# Patient Record
Sex: Female | Born: 1955 | Race: White | Hispanic: No | Marital: Single | Smoking: Former smoker
Health system: Southern US, Community
[De-identification: ages and names within clinical notes are randomized; demographics above are authoritative.]

## PROBLEM LIST (undated history)

## (undated) DIAGNOSIS — C229 Malignant neoplasm of liver, not specified as primary or secondary: Secondary | ICD-10-CM

## (undated) DIAGNOSIS — C801 Malignant (primary) neoplasm, unspecified: Secondary | ICD-10-CM

## (undated) DIAGNOSIS — N289 Disorder of kidney and ureter, unspecified: Secondary | ICD-10-CM

---

## 2000-04-27 ENCOUNTER — Encounter: Payer: Self-pay | Admitting: Family Medicine

## 2000-04-27 ENCOUNTER — Ambulatory Visit (HOSPITAL_COMMUNITY): Admission: RE | Admit: 2000-04-27 | Discharge: 2000-04-27 | Payer: Self-pay | Admitting: Family Medicine

## 2019-06-02 ENCOUNTER — Other Ambulatory Visit: Payer: Self-pay | Admitting: Family Medicine

## 2019-06-02 DIAGNOSIS — K439 Ventral hernia without obstruction or gangrene: Secondary | ICD-10-CM

## 2019-06-02 DIAGNOSIS — K429 Umbilical hernia without obstruction or gangrene: Secondary | ICD-10-CM

## 2019-06-03 ENCOUNTER — Ambulatory Visit
Admission: RE | Admit: 2019-06-03 | Discharge: 2019-06-03 | Disposition: A | Discharge status: Home / Self Care | Payer: Medicare Other | Source: Ambulatory Visit | Attending: Family Medicine | Admitting: Family Medicine

## 2019-06-03 DIAGNOSIS — K439 Ventral hernia without obstruction or gangrene: Secondary | ICD-10-CM

## 2019-06-03 DIAGNOSIS — K429 Umbilical hernia without obstruction or gangrene: Secondary | ICD-10-CM

## 2019-06-10 ENCOUNTER — Inpatient Hospital Stay (HOSPITAL_COMMUNITY)
Admission: EM | Admit: 2019-06-10 | Discharge: 2019-06-23 | DRG: 329 | Disposition: A | Discharge status: Home / Self Care | Payer: Medicare Other | Attending: Internal Medicine | Admitting: Internal Medicine

## 2019-06-10 ENCOUNTER — Encounter (HOSPITAL_COMMUNITY): Payer: Self-pay

## 2019-06-10 ENCOUNTER — Other Ambulatory Visit: Payer: Self-pay

## 2019-06-10 ENCOUNTER — Emergency Department (HOSPITAL_COMMUNITY): Payer: Medicare Other

## 2019-06-10 ENCOUNTER — Other Ambulatory Visit: Payer: Self-pay | Admitting: Surgery

## 2019-06-10 DIAGNOSIS — Z809 Family history of malignant neoplasm, unspecified: Secondary | ICD-10-CM

## 2019-06-10 DIAGNOSIS — D72829 Elevated white blood cell count, unspecified: Secondary | ICD-10-CM | POA: Diagnosis present

## 2019-06-10 DIAGNOSIS — C189 Malignant neoplasm of colon, unspecified: Secondary | ICD-10-CM | POA: Diagnosis present

## 2019-06-10 DIAGNOSIS — E876 Hypokalemia: Secondary | ICD-10-CM | POA: Diagnosis present

## 2019-06-10 DIAGNOSIS — C787 Secondary malignant neoplasm of liver and intrahepatic bile duct: Secondary | ICD-10-CM | POA: Diagnosis present

## 2019-06-10 DIAGNOSIS — Z20828 Contact with and (suspected) exposure to other viral communicable diseases: Secondary | ICD-10-CM | POA: Diagnosis present

## 2019-06-10 DIAGNOSIS — R634 Abnormal weight loss: Secondary | ICD-10-CM | POA: Diagnosis present

## 2019-06-10 DIAGNOSIS — R0602 Shortness of breath: Secondary | ICD-10-CM

## 2019-06-10 DIAGNOSIS — C786 Secondary malignant neoplasm of retroperitoneum and peritoneum: Secondary | ICD-10-CM | POA: Diagnosis present

## 2019-06-10 DIAGNOSIS — Z87891 Personal history of nicotine dependence: Secondary | ICD-10-CM

## 2019-06-10 DIAGNOSIS — E871 Hypo-osmolality and hyponatremia: Secondary | ICD-10-CM

## 2019-06-10 DIAGNOSIS — D649 Anemia, unspecified: Secondary | ICD-10-CM | POA: Diagnosis present

## 2019-06-10 DIAGNOSIS — R7401 Elevation of levels of liver transaminase levels: Secondary | ICD-10-CM | POA: Diagnosis not present

## 2019-06-10 DIAGNOSIS — E872 Acidosis: Secondary | ICD-10-CM | POA: Diagnosis present

## 2019-06-10 DIAGNOSIS — Z6820 Body mass index (BMI) 20.0-20.9, adult: Secondary | ICD-10-CM

## 2019-06-10 DIAGNOSIS — C184 Malignant neoplasm of transverse colon: Principal | ICD-10-CM | POA: Diagnosis present

## 2019-06-10 DIAGNOSIS — E878 Other disorders of electrolyte and fluid balance, not elsewhere classified: Secondary | ICD-10-CM | POA: Diagnosis present

## 2019-06-10 DIAGNOSIS — K567 Ileus, unspecified: Secondary | ICD-10-CM | POA: Diagnosis not present

## 2019-06-10 DIAGNOSIS — E43 Unspecified severe protein-calorie malnutrition: Secondary | ICD-10-CM | POA: Diagnosis present

## 2019-06-10 DIAGNOSIS — K56609 Unspecified intestinal obstruction, unspecified as to partial versus complete obstruction: Secondary | ICD-10-CM

## 2019-06-10 HISTORY — DX: Disorder of kidney and ureter, unspecified: N28.9

## 2019-06-10 LAB — URINALYSIS, ROUTINE W REFLEX MICROSCOPIC
Bacteria, UA: NONE SEEN
Bilirubin Urine: NEGATIVE
Glucose, UA: NEGATIVE mg/dL
Hgb urine dipstick: NEGATIVE
Ketones, ur: 80 mg/dL — AB
Nitrite: NEGATIVE
Protein, ur: 100 mg/dL — AB
Specific Gravity, Urine: 1.024 (ref 1.005–1.030)
pH: 5 (ref 5.0–8.0)

## 2019-06-10 LAB — COMPREHENSIVE METABOLIC PANEL
ALT: 25 U/L (ref 0–44)
AST: 26 U/L (ref 15–41)
Albumin: 3.3 g/dL — ABNORMAL LOW (ref 3.5–5.0)
Alkaline Phosphatase: 145 U/L — ABNORMAL HIGH (ref 38–126)
Anion gap: 21 — ABNORMAL HIGH (ref 5–15)
BUN: 12 mg/dL (ref 8–23)
CO2: 25 mmol/L (ref 22–32)
Calcium: 8.9 mg/dL (ref 8.9–10.3)
Chloride: 87 mmol/L — ABNORMAL LOW (ref 98–111)
Creatinine, Ser: 0.61 mg/dL (ref 0.44–1.00)
GFR calc Af Amer: >60 mL/min (ref >60)
GFR calc non Af Amer: >60 mL/min (ref >60)
Glucose, Bld: 107 mg/dL — ABNORMAL HIGH (ref 70–99)
Potassium: 2.8 mmol/L — ABNORMAL LOW (ref 3.5–5.1)
Sodium: 133 mmol/L — ABNORMAL LOW (ref 135–145)
Total Bilirubin: 1 mg/dL (ref 0.3–1.2)
Total Protein: 7.1 g/dL (ref 6.5–8.1)

## 2019-06-10 LAB — CBC
HCT: 40.2 % (ref 36.0–46.0)
Hemoglobin: 13.1 g/dL (ref 12.0–15.0)
MCH: 27 pg (ref 26.0–34.0)
MCHC: 32.6 g/dL (ref 30.0–36.0)
MCV: 82.9 fL (ref 80.0–100.0)
Platelets: 440 10*3/uL — ABNORMAL HIGH (ref 150–400)
RBC: 4.85 MIL/uL (ref 3.87–5.11)
RDW: 12.9 % (ref 11.5–15.5)
WBC: 12.3 10*3/uL — ABNORMAL HIGH (ref 4.0–10.5)
nRBC: 0 % (ref 0.0–0.2)

## 2019-06-10 LAB — LIPASE, BLOOD: Lipase: 22 U/L (ref 11–51)

## 2019-06-10 MED ORDER — SODIUM CHLORIDE (PF) 0.9 % IJ SOLN
INTRAMUSCULAR | Status: AC
  Administered 2019-06-17: 3 mL via INTRAVENOUS
  Filled 2019-06-10: qty 50

## 2019-06-10 MED ORDER — SODIUM CHLORIDE 0.9% FLUSH
3.0000 mL | Freq: Once | INTRAVENOUS | Status: AC
Start: 2019-06-10 — End: 2019-06-17
  Administered 2019-06-17: 3 mL via INTRAVENOUS

## 2019-06-10 MED ORDER — POTASSIUM CHLORIDE 10 MEQ/100ML IV SOLN
10.0000 meq | Freq: Once | INTRAVENOUS | Status: AC
  Administered 2019-06-10: 23:00:00 10 meq via INTRAVENOUS
  Filled 2019-06-10: qty 100

## 2019-06-10 MED ORDER — SODIUM CHLORIDE 0.9 % IV BOLUS
1000.0000 mL | Freq: Once | INTRAVENOUS | Status: AC
  Administered 2019-06-10: 1000 mL via INTRAVENOUS

## 2019-06-10 MED ORDER — IOHEXOL 300 MG/ML  SOLN
30.0000 mL | Freq: Once | INTRAMUSCULAR | Status: AC | PRN
  Administered 2019-06-11: 30 mL via ORAL

## 2019-06-10 MED ORDER — IOHEXOL 300 MG/ML  SOLN
100.0000 mL | Freq: Once | INTRAMUSCULAR | Status: AC | PRN
  Administered 2019-06-11: 100 mL via INTRAVENOUS

## 2019-06-10 MED ORDER — POTASSIUM CHLORIDE CRYS ER 20 MEQ PO TBCR
40.0000 meq | EXTENDED_RELEASE_TABLET | Freq: Once | ORAL | Status: AC
  Administered 2019-06-10: 23:00:00 40 meq via ORAL
  Filled 2019-06-10: qty 2

## 2019-06-10 NOTE — ED Triage Notes (Signed)
Patient reports that she has had abdominal pain, N/V/D x 6 weeks. According to paperwork, patient was referred to a surgeon for a chronic small umbilical hernia. Patient states today, they did not think her symptoms were related to the hernia and told her to come to the ED for Iv fluids, CT scan.

## 2019-06-10 NOTE — ED Provider Notes (Signed)
[facility] DEPT Provider Note   CSN: [id number] Arrival date & time: 06/10/19  1156     History   Chief Complaint Chief Complaint  Patient presents with   Abdominal Pain   Emesis   Diarrhea    HPI Ellen Larson is a 63 y.o. female with no significant past medical history presents to the ED with a 6-week history of nausea and bilious vomiting.  Patient has not regularly seen a primary care provider in over 20 years, but saw a doctor from [place] who referred her to a surgeon for a small umbilical hernia that was discovered on a limited US.  Surgeon sent her to the ED after learning of her history, believing that her symptoms are not related to the hernia and suspecting CT need for further evaluation.  Patient admits to a significantly diminished appetite, attributable mostly to the fact that PO intake contributes to her nausea and vomiting.  She reports a 20 pound weight loss in 6 weeks and will have occasional left upper quadrant abdominal pain.  She feels lethargic and nauseated at all times.  She was tested for COVID approximately 10 days ago which was negative.  No sick contacts.  Patient denies any fevers, chills, chest pain, hematemesis, hematochezia, melena, shortness of breath, dizziness, or focal neurologic symptoms.  She reports that her last large, full formed bowel movement was over 2 months ago and instead has scant, thin or pebble shaped feces once every couple of days.  She has never had a colonoscopy.     HPI  Past Medical History:  Diagnosis Date   Renal disorder     Patient Active Problem List   Diagnosis Date Noted   SBO (small bowel obstruction) ([place]) 06/11/2019   Hypokalemia 06/11/2019   Weight loss 06/11/2019    Past Surgical History:  Procedure Laterality Date   CERVICAL ABLATION     kidney stone removal       OB History   No obstetric history on file.      Home Medications    Prior to  Admission medications   Not on File    Family History Family History  Problem Relation Age of Onset   Heart failure Mother    Kidney failure Mother    Cancer Mother     Social History Social History   Tobacco Use   Smoking status: Former Smoker   Smokeless tobacco: Never Used  Substance Use Topics   Alcohol use: Yes   Drug use: Not Currently     Allergies   Patient has no known allergies.   Review of Systems Review of Systems Ten systems are reviewed and are negative for acute change except as noted in the HPI   Physical Exam Updated Vital Signs BP (!) 153/98    Pulse (!) 136    Temp 99 F (37.2 C) (Oral)    Resp 20    Ht 5\' 3"  (1.6 m)    Wt 49 kg    SpO2 98%    BMI 19.13 kg/m   Physical Exam Constitutional:      Appearance: Normal appearance.  HENT:     Head: Normocephalic and atraumatic.  Eyes:     General: No scleral icterus.    Conjunctiva/sclera: Conjunctivae normal.  Cardiovascular:     Rate and Rhythm: Normal rate and regular rhythm.  Pulmonary:     Effort: Pulmonary effort is normal.  Abdominal:     General: Bowel sounds are  increased.     Palpations: Abdomen is soft. There is mass.     Tenderness: There is abdominal tenderness in the left upper quadrant. There is no guarding.     Hernia: A hernia is present. Hernia is present in the umbilical area.  Neurological:     Mental Status: She is alert.     GCS: GCS eye subscore is 4. GCS verbal subscore is 5. GCS motor subscore is 6.  Psychiatric:        Mood and Affect: Mood normal.        Behavior: Behavior normal.        Thought Content: Thought content normal.      ED Treatments / Results  Labs (all labs ordered are listed, but only abnormal results are displayed) Labs Reviewed  COMPREHENSIVE METABOLIC PANEL - Abnormal; Notable for the following components:      Result Value   Sodium 133 (*)    Potassium 2.8 (*)    Chloride 87 (*)    Glucose, Bld 107 (*)    Albumin 3.3 (*)     Alkaline Phosphatase 145 (*)    Anion gap 21 (*)    All other components within normal limits  CBC - Abnormal; Notable for the following components:   WBC 12.3 (*)    Platelets 440 (*)    All other components within normal limits  URINALYSIS, ROUTINE W REFLEX MICROSCOPIC - Abnormal; Notable for the following components:   APPearance CLOUDY (*)    Ketones, ur 80 (*)    Protein, ur 100 (*)    Leukocytes,Ua TRACE (*)    All other components within normal limits  SARS CORONAVIRUS 2 (HOSPITAL ORDER, [facility])  LIPASE, BLOOD    EKG EKG Interpretation  Date/Time:  Friday June 10 2019 21:18:47 EDT Ventricular Rate:  97 PR Interval:    QRS Duration: 90 QT Interval:  315 QTC Calculation: 401 R Axis:   68 Text Interpretation:  Sinus rhythm Borderline repolarization abnormality Baseline wander in lead(s) V1 V5 V6 No previous ECGs available Confirmed by Fredia Sorrow [phone]) on 06/10/2019 9:34:56 PM   Radiology Ct Abdomen Pelvis W Contrast  Result Date: 06/11/2019 CLINICAL DATA:  Abdominal pain. Nausea, vomiting, diarrhea. Bilious vomiting. EXAM: CT ABDOMEN AND PELVIS WITH CONTRAST TECHNIQUE: Multidetector CT imaging of the abdomen and pelvis was performed using the standard protocol following bolus administration of intravenous contrast. CONTRAST:  1102mL OMNIPAQUE IOHEXOL 300 MG/ML  SOLN COMPARISON:  None. FINDINGS: Lower chest: Mild hypoventilatory changes at the lung bases. No pleural fluid. Hepatobiliary: Multiple hypodense liver lesions, many of which are ill-defined, suspicious for metastatic disease. Dominant lesion in the subcapsular right lobe measures 4.1 x 2.9 cm, series 2, image 12. Gallbladder is partially distended, no calcified gallstone. No biliary dilatation. Pancreas: No evidence pancreatic mass. No ductal dilatation or inflammation. Spleen: Normal in size without focal abnormality. Adrenals/Urinary Tract: Mild left adrenal thickening without  focal nodule. Normal right adrenal gland. No hydronephrosis or perinephric edema. Homogeneous renal enhancement with symmetric excretion on delayed phase imaging. Small subcentimeter hypodensity in the mid left kidney is too small to characterize but likely cyst. Urinary bladder is physiologically distended without wall thickening. Early excreted contrast in both renal collecting systems in the urinary bladder. Stomach/Bowel: Prominently distended stomach. Dilated fluid-filled small bowel with transition point in the left lower quadrant, series 2, image 53. No obvious small bowel mass or cause of obstruction. More distal small bowel is decompressed. The  appendix is normal. Within the proximal transverse colon there is an approximately 4.4 cm segment of irregular low-density colonic wall thickening. Colon distal to this is decompressed and not well evaluated. Mild sigmoid diverticulosis without diverticulitis. Vascular/Lymphatic: Mild aortic atherosclerosis without aneurysm. Portal and mesenteric veins are patent. Suspected prominent pericolonic nodes adjacent irregular transverse wall thickening,, series 2, image 24, however difficult to delineate from adjacent omental stranding. No retroperitoneal adenopathy. No enlarged pelvic lymph nodes. Reproductive: Small enhancing foci anterior to the uterine fundus and posterior to the cervix (series 5, image 59 and series 2, image 72 respectively). Ovaries tentatively visualized and normal. Other: Heterogeneous ill-defined soft tissue density and stranding in the anterior omentum suspicious for omental caking. Small volume abdominopelvic ascites. No free air. Tiny fat containing umbilical hernia. Musculoskeletal: No blastic or destructive lytic lesions. Minor degenerative change in the spine. IMPRESSION: 1. Acute finding of small-bowel obstruction with transition point in the left lower quadrant. 2. More ominous finding of irregular 4.4 cm low-density colonic wall  thickening in the proximal transverse colon, highly suspicious for primary colonic malignancy. Adjacent omental stranding and small volume abdominopelvic ascites, consistent with omental caking. Small volume abdominopelvic ascites. Small bowel obstruction may be due to mesenteric implant in this setting. Recommend oncologic workup and colonoscopy for further evaluation of potential colonic lesions. 3. Multiple hypodense liver lesions, many of which are ill-defined, suspicious for metastatic disease. 4. Small enhancing foci in the pelvis anterior to the uterine fundus and posterior with cervix may represent pelvic soft tissue deposits or less likely fibroids are also considered. Aortic Atherosclerosis (ICD10-I70.0). Electronically Signed   By: Keith Rake M.D.   On: 06/11/2019 00:53    Procedures Procedures (including critical care time)  Medications Ordered in ED Medications  sodium chloride flush (NS) 0.9 % injection 3 mL (has no administration in time range)  sodium chloride (PF) 0.9 % injection (has no administration in time range)  fentaNYL (SUBLIMAZE) injection 50 mcg (has no administration in time range)  ondansetron (ZOFRAN) injection 4 mg (has no administration in time range)  sodium chloride 0.9 % bolus 1,000 mL (0 mLs Intravenous Stopped 06/11/19 0117)  potassium chloride 10 mEq in 100 mL IVPB (0 mEq Intravenous Stopped 06/11/19 0117)  potassium chloride SA (K-DUR) CR tablet 40 mEq (40 mEq Oral Given 06/10/19 2258)  iohexol (OMNIPAQUE) 300 MG/ML solution 100 mL (100 mLs Intravenous Contrast Given 06/11/19 0017)  iohexol (OMNIPAQUE) 300 MG/ML solution 30 mL (30 mLs Oral Contrast Given 06/11/19 0016)  sodium chloride 0.9 % bolus 1,000 mL (1,000 mLs Intravenous New Bag/Given 06/11/19 0120)     Initial Impression / Assessment and Plan / ED Course  I have reviewed the triage vital signs and the nursing notes.  Pertinent labs & imaging results that were available during my care of the  patient were reviewed by me and considered in my medical decision making (see chart for details).  Clinical Course as of Jun 10 121  Fri Jun 10, 2019  2211 Potassium(!): 2.8 [GG]    Clinical Course User Index [GG] Corena Herter, PA-C       Abdominal CT with contrast was reviewed and demonstrates a small bowel obstruction as well as findings concerning for primary colonic malignancy.  CT also revealed multiple hypodense liver lesions concerning for metastases.  Her obstruction visualized on CT is consistent with her history and physical exam findings.  NG tube provided in the ER.  IV and p.o. potassium was also provided in addition to  fluids for her hypokalemia.  Patient denies any chest pain, shortness of breath, or dizziness. Patient will be admitted to the hospital and will require surgical and oncological consult.  Providing pain medication and Zofran as needed to make her comfortable in interim.  Provide updates patient who vocalized understanding and is agreeable to plan.   Final Clinical Impressions(s) / ED Diagnoses   Final diagnoses:  Small bowel obstruction [facility])  Hypokalemia    ED Discharge Orders    None       Corena Herter, PA-C 06/11/19 0146    Fredia Sorrow, MD 06/25/19 [id number]

## 2019-06-11 ENCOUNTER — Inpatient Hospital Stay: Payer: Self-pay

## 2019-06-11 ENCOUNTER — Emergency Department (HOSPITAL_COMMUNITY): Payer: Medicare Other

## 2019-06-11 DIAGNOSIS — Z809 Family history of malignant neoplasm, unspecified: Secondary | ICD-10-CM | POA: Diagnosis not present

## 2019-06-11 DIAGNOSIS — K567 Ileus, unspecified: Secondary | ICD-10-CM | POA: Diagnosis not present

## 2019-06-11 DIAGNOSIS — K56609 Unspecified intestinal obstruction, unspecified as to partial versus complete obstruction: Secondary | ICD-10-CM | POA: Diagnosis present

## 2019-06-11 DIAGNOSIS — R634 Abnormal weight loss: Secondary | ICD-10-CM | POA: Diagnosis not present

## 2019-06-11 DIAGNOSIS — E876 Hypokalemia: Secondary | ICD-10-CM | POA: Diagnosis present

## 2019-06-11 DIAGNOSIS — E43 Unspecified severe protein-calorie malnutrition: Secondary | ICD-10-CM | POA: Diagnosis present

## 2019-06-11 DIAGNOSIS — E878 Other disorders of electrolyte and fluid balance, not elsewhere classified: Secondary | ICD-10-CM | POA: Diagnosis present

## 2019-06-11 DIAGNOSIS — D649 Anemia, unspecified: Secondary | ICD-10-CM | POA: Diagnosis present

## 2019-06-11 DIAGNOSIS — C787 Secondary malignant neoplasm of liver and intrahepatic bile duct: Secondary | ICD-10-CM | POA: Diagnosis present

## 2019-06-11 DIAGNOSIS — Z20828 Contact with and (suspected) exposure to other viral communicable diseases: Secondary | ICD-10-CM | POA: Diagnosis present

## 2019-06-11 DIAGNOSIS — C184 Malignant neoplasm of transverse colon: Secondary | ICD-10-CM | POA: Diagnosis present

## 2019-06-11 DIAGNOSIS — C786 Secondary malignant neoplasm of retroperitoneum and peritoneum: Secondary | ICD-10-CM | POA: Diagnosis present

## 2019-06-11 DIAGNOSIS — E871 Hypo-osmolality and hyponatremia: Secondary | ICD-10-CM | POA: Diagnosis present

## 2019-06-11 DIAGNOSIS — Z87891 Personal history of nicotine dependence: Secondary | ICD-10-CM | POA: Diagnosis not present

## 2019-06-11 DIAGNOSIS — R0602 Shortness of breath: Secondary | ICD-10-CM | POA: Diagnosis not present

## 2019-06-11 DIAGNOSIS — C189 Malignant neoplasm of colon, unspecified: Secondary | ICD-10-CM | POA: Diagnosis present

## 2019-06-11 DIAGNOSIS — Z6820 Body mass index (BMI) 20.0-20.9, adult: Secondary | ICD-10-CM | POA: Diagnosis not present

## 2019-06-11 DIAGNOSIS — D72829 Elevated white blood cell count, unspecified: Secondary | ICD-10-CM | POA: Diagnosis present

## 2019-06-11 DIAGNOSIS — R740 Nonspecific elevation of levels of transaminase and lactic acid dehydrogenase [LDH]: Secondary | ICD-10-CM | POA: Diagnosis not present

## 2019-06-11 DIAGNOSIS — E872 Acidosis: Secondary | ICD-10-CM | POA: Diagnosis present

## 2019-06-11 LAB — COMPREHENSIVE METABOLIC PANEL
ALT: 19 U/L (ref 0–44)
AST: 19 U/L (ref 15–41)
Albumin: 2.7 g/dL — ABNORMAL LOW (ref 3.5–5.0)
Alkaline Phosphatase: 123 U/L (ref 38–126)
Anion gap: 18 — ABNORMAL HIGH (ref 5–15)
BUN: 7 mg/dL — ABNORMAL LOW (ref 8–23)
CO2: 20 mmol/L — ABNORMAL LOW (ref 22–32)
Calcium: 8.2 mg/dL — ABNORMAL LOW (ref 8.9–10.3)
Chloride: 96 mmol/L — ABNORMAL LOW (ref 98–111)
Creatinine, Ser: 0.44 mg/dL (ref 0.44–1.00)
GFR calc Af Amer: >60 mL/min (ref >60)
GFR calc non Af Amer: >60 mL/min (ref >60)
Glucose, Bld: 146 mg/dL — ABNORMAL HIGH (ref 70–99)
Potassium: 3.1 mmol/L — ABNORMAL LOW (ref 3.5–5.1)
Sodium: 134 mmol/L — ABNORMAL LOW (ref 135–145)
Total Bilirubin: 1 mg/dL (ref 0.3–1.2)
Total Protein: 6 g/dL — ABNORMAL LOW (ref 6.5–8.1)

## 2019-06-11 LAB — CBC
HCT: 36.6 % (ref 36.0–46.0)
Hemoglobin: 12 g/dL (ref 12.0–15.0)
MCH: 27.5 pg (ref 26.0–34.0)
MCHC: 32.8 g/dL (ref 30.0–36.0)
MCV: 83.8 fL (ref 80.0–100.0)
Platelets: 361 10*3/uL (ref 150–400)
RBC: 4.37 MIL/uL (ref 3.87–5.11)
RDW: 13 % (ref 11.5–15.5)
WBC: 11.5 10*3/uL — ABNORMAL HIGH (ref 4.0–10.5)
nRBC: 0 % (ref 0.0–0.2)

## 2019-06-11 LAB — MAGNESIUM: Magnesium: 1.8 mg/dL (ref 1.7–2.4)

## 2019-06-11 LAB — HIV ANTIBODY (ROUTINE TESTING W REFLEX): HIV Screen 4th Generation wRfx: NONREACTIVE

## 2019-06-11 LAB — SARS CORONAVIRUS 2 BY RT PCR (HOSPITAL ORDER, PERFORMED IN ~~LOC~~ HOSPITAL LAB): SARS Coronavirus 2: NEGATIVE

## 2019-06-11 LAB — PHOSPHORUS: Phosphorus: 2.7 mg/dL (ref 2.5–4.6)

## 2019-06-11 MED ORDER — HEPARIN SODIUM (PORCINE) 5000 UNIT/ML IJ SOLN
5000.0000 [IU] | Freq: Three times a day (TID) | INTRAMUSCULAR | Status: DC
  Administered 2019-06-11 – 2019-06-12 (×4): 5000 [IU] via SUBCUTANEOUS
  Filled 2019-06-11 (×7): qty 1

## 2019-06-11 MED ORDER — LIDOCAINE HCL URETHRAL/MUCOSAL 2 % EX GEL
1.0000 "application " | Freq: Once | CUTANEOUS | Status: AC
  Administered 2019-06-11: 1
  Filled 2019-06-11: qty 5

## 2019-06-11 MED ORDER — ONDANSETRON HCL 4 MG/2ML IJ SOLN
4.0000 mg | Freq: Once | INTRAMUSCULAR | Status: AC
  Administered 2019-06-11: 01:00:00 4 mg via INTRAVENOUS
  Filled 2019-06-11: qty 2

## 2019-06-11 MED ORDER — KCL IN DEXTROSE-NACL 40-5-0.9 MEQ/L-%-% IV SOLN
INTRAVENOUS | Status: DC
  Administered 2019-06-11 – 2019-06-12 (×3): via INTRAVENOUS
  Filled 2019-06-11 (×4): qty 1000

## 2019-06-11 MED ORDER — SODIUM CHLORIDE 0.9 % IV BOLUS
1000.0000 mL | Freq: Once | INTRAVENOUS | Status: AC
  Administered 2019-06-11: 1000 mL via INTRAVENOUS

## 2019-06-11 MED ORDER — FENTANYL CITRATE (PF) 100 MCG/2ML IJ SOLN
50.0000 ug | Freq: Once | INTRAMUSCULAR | Status: AC
  Administered 2019-06-11: 50 ug via INTRAVENOUS
  Filled 2019-06-11: qty 2

## 2019-06-11 MED ORDER — ONDANSETRON HCL 4 MG/2ML IJ SOLN
4.0000 mg | Freq: Four times a day (QID) | INTRAMUSCULAR | Status: DC | PRN
  Administered 2019-06-11 (×2): 4 mg via INTRAVENOUS
  Filled 2019-06-11 (×2): qty 2

## 2019-06-11 MED ORDER — ONDANSETRON HCL 4 MG PO TABS: 4.0000 mg | ORAL_TABLET | Freq: Four times a day (QID) | ORAL | Status: DC | PRN

## 2019-06-11 MED ORDER — MORPHINE SULFATE (PF) 2 MG/ML IV SOLN
2.0000 mg | INTRAVENOUS | Status: DC | PRN
  Administered 2019-06-11 – 2019-06-12 (×6): 2 mg via INTRAVENOUS
  Filled 2019-06-11 (×8): qty 1

## 2019-06-11 MED ORDER — POTASSIUM CHLORIDE 10 MEQ/100ML IV SOLN
10.0000 meq | INTRAVENOUS | Status: AC
  Administered 2019-06-11 (×4): 10 meq via INTRAVENOUS
  Filled 2019-06-11 (×4): qty 100

## 2019-06-11 NOTE — ED Notes (Signed)
ED TO INPATIENT HANDOFF REPORT  Name/Age/Gender Ellen Larson 63 y.o. female  Code Status    Code Status Orders  (From admission, onward)         Start     Ordered   06/11/19 0311  Full code  Continuous     06/11/19 0310        Code Status History    This patient has a current code status but no historical code status.   Advance Care Planning Activity      Home/SNF/Other Home  Chief Complaint Nausea/ cant eat / trouble urinating   Level of Care/Admitting Diagnosis ED Disposition    ED Disposition Condition Comment   Admit  Hospital Area: [facility] [[id number]]  Level of Care: Med-Surg [16]  Covid Evaluation: Asymptomatic Screening Protocol (No Symptoms)  Diagnosis: SBO (small bowel obstruction) ([place]) [id number]  Admitting Physician: Elwyn Reach [[id number]]  Attending Physician: Elwyn Reach [[id number]]  Estimated length of stay: past midnight tomorrow  Certification:: I certify this patient will need inpatient services for at least 2 midnights  PT Class (Do Not Modify): Inpatient [101]  PT Acc Code (Do Not Modify): Private [1]       Medical History Past Medical History:  Diagnosis Date  . Renal disorder     Allergies No Known Allergies  IV Location/Drains/Wounds Patient Lines/Drains/Airways Status   Active Line/Drains/Airways    Name:   Placement date:   Placement time:   Site:   Days:   Peripheral IV 06/10/19 Right Forearm   06/10/19    2159    Forearm   1          Labs/Imaging Results for orders placed or performed during the hospital encounter of 06/10/19 (from the past 48 hour(s))  Lipase, blood     Status: None   Collection Time: 06/10/19 12:48 PM  Result Value Ref Range   Lipase 22 11 - 51 U/L    Comment: Performed at [facility], [place] [address] [place], [place] [address]  Comprehensive metabolic panel     Status: Abnormal   Collection Time: 06/10/19 12:48 PM  Result Value Ref Range   Sodium  133 (L) 135 - 145 mmol/L    Comment: REPEATED TO VERIFY   Potassium 2.8 (L) 3.5 - 5.1 mmol/L   Chloride 87 (L) 98 - 111 mmol/L    Comment: REPEATED TO VERIFY   CO2 25 22 - 32 mmol/L    Comment: REPEATED TO VERIFY   Glucose, Bld 107 (H) 70 - 99 mg/dL   BUN 12 8 - 23 mg/dL   Creatinine, Ser 0.61 0.44 - 1.00 mg/dL   Calcium 8.9 8.9 - 10.3 mg/dL   Total Protein 7.1 6.5 - 8.1 g/dL   Albumin 3.3 (L) 3.5 - 5.0 g/dL   AST 26 15 - 41 U/L   ALT 25 0 - 44 U/L   Alkaline Phosphatase 145 (H) 38 - 126 U/L   Total Bilirubin 1.0 0.3 - 1.2 mg/dL   GFR calc non Af Amer >60 >60 mL/min   GFR calc Af Amer >60 >60 mL/min   Anion gap 21 (H) 5 - 15    Comment: REPEATED TO VERIFY Performed at [facility] [address] [place], [place] [address]   CBC     Status: Abnormal   Collection Time: 06/10/19 12:48 PM  Result Value Ref Range   WBC 12.3 (H) 4.0 - 10.5 K/uL   RBC 4.85  3.87 - 5.11 MIL/uL   Hemoglobin 13.1 12.0 - 15.0 g/dL   HCT 40.2 36.0 - 46.0 %   MCV 82.9 80.0 - 100.0 fL   MCH 27.0 26.0 - 34.0 pg   MCHC 32.6 30.0 - 36.0 g/dL   RDW 12.9 11.5 - 15.5 %   Platelets 440 (H) 150 - 400 K/uL   nRBC 0.0 0.0 - 0.2 %    Comment: Performed at [facility], [place] [address] [place], [place] [address]  Urinalysis, Routine w reflex microscopic     Status: Abnormal   Collection Time: 06/10/19  8:00 PM  Result Value Ref Range   Color, Urine YELLOW YELLOW   APPearance CLOUDY (A) CLEAR   Specific Gravity, Urine 1.024 1.005 - 1.030   pH 5.0 5.0 - 8.0   Glucose, UA NEGATIVE NEGATIVE mg/dL   Hgb urine dipstick NEGATIVE NEGATIVE   Bilirubin Urine NEGATIVE NEGATIVE   Ketones, ur 80 (A) NEGATIVE mg/dL   Protein, ur 100 (A) NEGATIVE mg/dL   Nitrite NEGATIVE NEGATIVE   Leukocytes,Ua TRACE (A) NEGATIVE   RBC / HPF 0-5 0 - 5 RBC/hpf   Bacteria, UA NONE SEEN NONE SEEN   Squamous Epithelial / LPF 0-5 0 - 5   Mucus PRESENT    Hyaline Casts, UA PRESENT     Comment:  Performed at [facility], [place] [address], [place], [place] [address]  SARS Coronavirus 2 [facility] order, Performed in [facility] hospital lab) Nasopharyngeal Nasopharyngeal Swab     Status: None   Collection Time: 06/11/19  1:02 AM   Specimen: Nasopharyngeal Swab  Result Value Ref Range   SARS Coronavirus 2 NEGATIVE NEGATIVE    Comment: (NOTE) If result is NEGATIVE SARS-CoV-2 target nucleic acids are NOT DETECTED. The SARS-CoV-2 RNA is generally detectable in upper and lower  respiratory specimens during the acute phase of infection. The lowest  concentration of SARS-CoV-2 viral copies this assay can detect is 250  copies / mL. A negative result does not preclude SARS-CoV-2 infection  and should not be used as the sole basis for treatment or other  patient management decisions.  A negative result may occur with  improper specimen collection / handling, submission of specimen other  than nasopharyngeal swab, presence of viral mutation(s) within the  areas targeted by this assay, and inadequate number of viral copies  (<250 copies / mL). A negative result must be combined with clinical  observations, patient history, and epidemiological information. If result is POSITIVE SARS-CoV-2 target nucleic acids are DETECTED. The SARS-CoV-2 RNA is generally detectable in upper and lower  respiratory specimens dur ing the acute phase of infection.  Positive  results are indicative of active infection with SARS-CoV-2.  Clinical  correlation with patient history and other diagnostic information is  necessary to determine patient infection status.  Positive results do  not rule out bacterial infection or co-infection with other viruses. If result is PRESUMPTIVE POSTIVE SARS-CoV-2 nucleic acids MAY BE PRESENT.   A presumptive positive result was obtained on the submitted specimen  and confirmed on repeat testing.  While 2019 novel coronavirus  (SARS-CoV-2) nucleic acids may be  present in the submitted sample  additional confirmatory testing may be necessary for epidemiological  and / or clinical management purposes  to differentiate between  SARS-CoV-2 and other Sarbecovirus currently known to infect humans.  If clinically indicated additional testing with an alternate test  methodology [phone]) is advised. The SARS-CoV-2 RNA is generally  detectable in upper and lower respiratory sp ecimens during the acute  phase of infection. The expected result is Negative. Fact Sheet for Patients:  StrictlyIdeas.no Fact Sheet for Healthcare Providers: BankingDealers.co.za This test is not yet approved or cleared by the [place] FDA and has been authorized for detection and/or diagnosis of SARS-CoV-2 by FDA under an Emergency Use Authorization (EUA).  This EUA will remain in effect (meaning this test can be used) for the duration of the COVID-19 declaration under Section 564(b)(1) of the Act, 21 U.S.C. section 360bbb-3(b)(1), unless the authorization is terminated or revoked sooner. Performed at [facility], [place] [address] [place], [place] [address]    Ct Abdomen Pelvis W Contrast  Result Date: 06/11/2019 CLINICAL DATA:  Abdominal pain. Nausea, vomiting, diarrhea. Bilious vomiting. EXAM: CT ABDOMEN AND PELVIS WITH CONTRAST TECHNIQUE: Multidetector CT imaging of the abdomen and pelvis was performed using the standard protocol following bolus administration of intravenous contrast. CONTRAST:  123mL OMNIPAQUE IOHEXOL 300 MG/ML  SOLN COMPARISON:  None. FINDINGS: Lower chest: Mild hypoventilatory changes at the lung bases. No pleural fluid. Hepatobiliary: Multiple hypodense liver lesions, many of which are ill-defined, suspicious for metastatic disease. Dominant lesion in the subcapsular right lobe measures 4.1 x 2.9 cm, series 2, image 12. Gallbladder is partially distended, no calcified gallstone. No  biliary dilatation. Pancreas: No evidence pancreatic mass. No ductal dilatation or inflammation. Spleen: Normal in size without focal abnormality. Adrenals/Urinary Tract: Mild left adrenal thickening without focal nodule. Normal right adrenal gland. No hydronephrosis or perinephric edema. Homogeneous renal enhancement with symmetric excretion on delayed phase imaging. Small subcentimeter hypodensity in the mid left kidney is too small to characterize but likely cyst. Urinary bladder is physiologically distended without wall thickening. Early excreted contrast in both renal collecting systems in the urinary bladder. Stomach/Bowel: Prominently distended stomach. Dilated fluid-filled small bowel with transition point in the left lower quadrant, series 2, image 53. No obvious small bowel mass or cause of obstruction. More distal small bowel is decompressed. The appendix is normal. Within the proximal transverse colon there is an approximately 4.4 cm segment of irregular low-density colonic wall thickening. Colon distal to this is decompressed and not well evaluated. Mild sigmoid diverticulosis without diverticulitis. Vascular/Lymphatic: Mild aortic atherosclerosis without aneurysm. Portal and mesenteric veins are patent. Suspected prominent pericolonic nodes adjacent irregular transverse wall thickening,, series 2, image 24, however difficult to delineate from adjacent omental stranding. No retroperitoneal adenopathy. No enlarged pelvic lymph nodes. Reproductive: Small enhancing foci anterior to the uterine fundus and posterior to the cervix (series 5, image 59 and series 2, image 72 respectively). Ovaries tentatively visualized and normal. Other: Heterogeneous ill-defined soft tissue density and stranding in the anterior omentum suspicious for omental caking. Small volume abdominopelvic ascites. No free air. Tiny fat containing umbilical hernia. Musculoskeletal: No blastic or destructive lytic lesions. Minor  degenerative change in the spine. IMPRESSION: 1. Acute finding of small-bowel obstruction with transition point in the left lower quadrant. 2. More ominous finding of irregular 4.4 cm low-density colonic wall thickening in the proximal transverse colon, highly suspicious for primary colonic malignancy. Adjacent omental stranding and small volume abdominopelvic ascites, consistent with omental caking. Small volume abdominopelvic ascites. Small bowel obstruction may be due to mesenteric implant in this setting. Recommend oncologic workup and colonoscopy for further evaluation of potential colonic lesions. 3. Multiple hypodense liver lesions, many of which are ill-defined, suspicious for metastatic disease. 4. Small enhancing foci in the pelvis anterior to the uterine fundus and  posterior with cervix may represent pelvic soft tissue deposits or less likely fibroids are also considered. Aortic Atherosclerosis (ICD10-I70.0). Electronically Signed   By: Keith Rake M.D.   On: 06/11/2019 00:53    Pending Labs Unresulted Labs (From admission, onward)    Start     Ordered   06/11/19 0500  Comprehensive metabolic panel  Tomorrow morning,   R     06/11/19 0310   06/11/19 0500  CBC  Tomorrow morning,   R     06/11/19 0310   06/11/19 0311  HIV antibody (Routine Testing)  Once,   STAT     06/11/19 0310          Vitals/Pain Today's Vitals   06/11/19 0000 06/11/19 0230 06/11/19 0300 06/11/19 0311  BP: (!) 155/87 (!) 141/66 (!) 145/72   Pulse: 99 98 99   Resp: 17 19 15    Temp:      TempSrc:      SpO2: 98% 99% 98%   Weight:      Height:      PainSc:    4     Isolation Precautions No active isolations  Medications Medications  sodium chloride flush (NS) 0.9 % injection 3 mL (has no administration in time range)  heparin injection 5,000 Units (has no administration in time range)  dextrose 5 % and 0.9 % NaCl with KCl 40 mEq/L infusion ( Intravenous New Bag/Given 06/11/19 0342)  ondansetron  (ZOFRAN) tablet 4 mg (has no administration in time range)    Or  ondansetron (ZOFRAN) injection 4 mg (has no administration in time range)  morphine 2 MG/ML injection 2 mg (has no administration in time range)  sodium chloride 0.9 % bolus 1,000 mL (0 mLs Intravenous Stopped 06/11/19 0117)  potassium chloride 10 mEq in 100 mL IVPB (0 mEq Intravenous Stopped 06/11/19 0117)  potassium chloride SA (K-DUR) CR tablet 40 mEq (40 mEq Oral Given 06/10/19 2258)  iohexol (OMNIPAQUE) 300 MG/ML solution 100 mL (100 mLs Intravenous Contrast Given 06/11/19 0017)  iohexol (OMNIPAQUE) 300 MG/ML solution 30 mL (30 mLs Oral Contrast Given 06/11/19 0016)  sodium chloride 0.9 % bolus 1,000 mL ( Intravenous Stopped 06/11/19 0234)  fentaNYL (SUBLIMAZE) injection 50 mcg (50 mcg Intravenous Given 06/11/19 0122)  ondansetron (ZOFRAN) injection 4 mg (4 mg Intravenous Given 06/11/19 0122)  lidocaine (XYLOCAINE) 2 % jelly 1 application (1 application Other Given 06/11/19 0236)    Mobility walks

## 2019-06-11 NOTE — Progress Notes (Signed)
Patient is here started prior to midnight in the ED and she was admitted this morning by Dr. Tanda Rockers and I am in current agreement with His Assessment and Plan.  Additional changes to her plan of care been made accordingly.  Patient is a 63 year old Caucasian female with a past medical history of renal disease and no other past medical history presented to the ED with nausea vomiting and abdominal pain as well as unexplained significant weight loss.  She is unable to keep her food down next extensively and has been having intermittent nausea vomiting over the last 6 weeks which is gotten worse.  She has not seen a doctor in over 20 years as well.  She was seen in outpatient setting for an umbilical hernia referred to general surgery and sent to the ER based on her history.  In the ER she was evaluated and found to have a small bowel obstruction and a colonic malignancy likely with metastasis.  She has been having loose stools mainly with small formed stools and last bowel movement was yesterday.  She was evaluated and admitted for small bowel obstruction and general surgery was consulted.  Small bowel obstruction is likely secondary to her colonic malignancy and an NG tube was to be inserted but I do not think one was as when I walked in the room she did not have 1.  General surgery evaluated and she is currently n.p.o. and having IV fluids and supportive care.  Dr. 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  He feels that she will likely require diverting colostomy if it is even possible to work an abdominal cavity and she will be started on TPN with definitive plans for surgery on Monday morning.  She was admitted for the following but not limited to:  Small bowel obstruction -Suspected secondary to colon  malignancy.  -Has never had colonoscopy.  Surgery to evaluate patient.  -IV fluids and supportive care. -Continue with antiemetics with ondansetron 4 mg p.o./IV every 6 as needed for nausea and pain control with IV morphine 2 g every 4 PRN for severe pain -We will place PICC line and start TPN based on general surgery recommendations -Appreciate further surgical care  Suspected colon cancer with metastasis -Patient will require colonoscopy at some point with biopsy but after discussion with GI and general surgery she is going to go for surgical intervention on Monday -Supportive care mainly at this point.  Hypokalemia -Potassium on admission was 2.8 and has now improved to 3.1 but still low -Replete with IV KCl 40 mEq and with D5 normal saline +40 mEq at a rate of 75 mL's per hour now -Continue to monitor and replete as necessary -Repeat CMP in a.m.  Unintentional weight loss -Most likely secondary to suspected cancer as well as anorexia.   -Nutrition counseling with addressing underlying cause. -We will place a PICC line for TPN and will call dietitian for further evaluation recommendations  Hyponatremia/Hypochloremia -In setting of poor p.o. intake -Continue with supportive care and IV fluid hydration with D5 normal saline +40 mEq of KCl at 125/h and will reduce it down to 75 mL's per hour -Patient's sodium is now improving to 134 and chloride has gone from 87 is now 96 -Repeat CMP in the a.m.  Anion Gap Metabolic Acidosis -Continue  with supportive care and IV fluids as above -Patient CO2 is now 20 and anion gap is 18 -Continue to monitor and trend and repeat CMP in a.m.  We will continue to monitor patient's clinical response to intervention and follow-up on general surgery recommendations.  We will repeat blood work and imaging in the a.m. and place a PICC line for TPN administration

## 2019-06-11 NOTE — H&P (Signed)
History and Physical   Ellen Larson [id number] DOB: May 08, 1956 DOA: 06/10/2019  Referring MD/NP/PA: Dr. Venita Sheffield  PCP: Patient, No Pcp Per   Patient coming from: Home  Chief Complaint: Nausea vomiting abdominal pain  HPI: Ellen Larson is a 63 y.o. female with medical history significant of no significant past medical history mainly previous renal disease who presented to the ER with nausea vomiting abdominal pain as well as unexplained significant weight loss.  Patient was unable to keep food down extensively.  She has been having intermittent nausea with vomiting over the last 6 weeks that has gotten worse now.  She has not seen a doctor in close to 20 years.  She was seen in the outpatient setting for umbilical hernia and referred to general surgery.  Patient was sent to the ER based on her history.  In the ER she was found to be cachectic woman with a evaluation showing small bowel obstruction and possible colon malignancy with metastasis.  She has lost about 20 pounds in the last 6 weeks.  She has some pain and vague sensation in the left upper quadrant.  Patient reported significant anorexia.  Denied any significant diarrhea.  She has been having loose stools stools mainly with no formed stool.  Last bowel movement was yesterday.  Patient being admitted with a small bowel obstruction for evaluation.  Surgery consulted.  ED Course: Temperature is 99 blood pressure 154/87 pulse 136 respiratory rate of 22 oxygen sat 93 of resting room air.  Sodium 133 potassium 2.8 chloride 87 CO2 25 glucose 107 BUN 12 creatinine 21 alkaline phosphatase 145 albumin 3.3.  White count 20.3 otherwise CBC within normal urinalysis within normal.  COVID-19 screen is currently in pending.  CT abdomen pelvis shows acute finding of small bowel obstruction with transition point in the left lower quadrant.  There is also a 4.4 cm low-density irregular finding in the colonic wall in the proximal transverse colon.  It  is highly suspicious for primary colon cancer.  Multiple other findings including possible liver lesions.  Patient has NG tube inserted and is being admitted to the hospital for treatment.  Review of Systems: As per HPI otherwise 10 point review of systems negative.    Past Medical History:  Diagnosis Date   Renal disorder     Past Surgical History:  Procedure Laterality Date   CERVICAL ABLATION     kidney stone removal       reports that she has quit smoking. She has never used smokeless tobacco. She reports current alcohol use. She reports previous drug use.  No Known Allergies  Family History  Problem Relation Age of Onset   Heart failure Mother    Kidney failure Mother    Cancer Mother      Prior to Admission medications   Not on File    Physical Exam: Vitals:   06/10/19 2130 06/10/19 2200 06/10/19 2230 06/10/19 2300  BP:    (!) 153/98  Pulse: 96 (!) 103 (!) 110 (!) 136  Resp: 16 20 (!) 22 20  Temp:      TempSrc:      SpO2: 97% 99% 100% 98%  Weight:      Height:          Constitutional: Cachectic, pleasant, chronically ill looking no acute disease Vitals:   06/10/19 2130 06/10/19 2200 06/10/19 2230 06/10/19 2300  BP:    (!) 153/98  Pulse: 96 (!) 103 (!) 110 (!) 136  Resp: 16 20 (!)  22 20  Temp:      TempSrc:      SpO2: 97% 99% 100% 98%  Weight:      Height:       Eyes: PERRL, lids and conjunctivae normal ENMT: Mucous membranes are dry. Posterior pharynx clear of any exudate or lesions.Normal dentition.  Neck: normal, supple, no masses, no thyromegaly Respiratory: clear to auscultation bilaterally, no wheezing, no crackles. Normal respiratory effort. No accessory muscle use.  Cardiovascular: Regular rate and rhythm, no murmurs / rubs / gallops. No extremity edema. 2+ pedal pulses. No carotid bruits.  Abdomen: Soft, nondistended, mild diffuse tenderness, no masses palpated. No hepatosplenomegaly. Bowel sounds positive.  Musculoskeletal: no  clubbing / cyanosis. No joint deformity upper and lower extremities. Good ROM, no contractures. Normal muscle tone.  Skin: no rashes, lesions, ulcers. No induration Neurologic: CN 2-12 grossly intact. Sensation intact, DTR normal. Strength 5/5 in all 4.  Psychiatric: Normal judgment and insight. Alert and oriented x 3. Normal mood.     Labs on Admission: I have personally reviewed following labs and imaging studies  CBC: Recent Labs  Lab 06/10/19 1248  WBC 12.3*  HGB 13.1  HCT 40.2  MCV 82.9  PLT 123456*   Basic Metabolic Panel: Recent Labs  Lab 06/10/19 1248  NA 133*  K 2.8*  CL 87*  CO2 25  GLUCOSE 107*  BUN 12  CREATININE 0.61  CALCIUM 8.9   GFR: Estimated Creatinine Clearance: 54.2 mL/min (by C-G formula based on SCr of 0.61 mg/dL). Liver Function Tests: Recent Labs  Lab 06/10/19 1248  AST 26  ALT 25  ALKPHOS 145*  BILITOT 1.0  PROT 7.1  ALBUMIN 3.3*   Recent Labs  Lab 06/10/19 1248  LIPASE 22   No results for input(s): AMMONIA in the last 168 hours. Coagulation Profile: No results for input(s): INR, PROTIME in the last 168 hours. Cardiac Enzymes: No results for input(s): CKTOTAL, CKMB, CKMBINDEX, TROPONINI in the last 168 hours. BNP (last 3 results) No results for input(s): PROBNP in the last 8760 hours. HbA1C: No results for input(s): HGBA1C in the last 72 hours. CBG: No results for input(s): GLUCAP in the last 168 hours. Lipid Profile: No results for input(s): CHOL, HDL, LDLCALC, TRIG, CHOLHDL, LDLDIRECT in the last 72 hours. Thyroid Function Tests: No results for input(s): TSH, T4TOTAL, FREET4, T3FREE, THYROIDAB in the last 72 hours. Anemia Panel: No results for input(s): VITAMINB12, FOLATE, FERRITIN, TIBC, IRON, RETICCTPCT in the last 72 hours. Urine analysis:    Component Value Date/Time   COLORURINE YELLOW 06/10/2019 2000   APPEARANCEUR CLOUDY (A) 06/10/2019 2000   LABSPEC 1.024 06/10/2019 2000   PHURINE 5.0 06/10/2019 2000   GLUCOSEU  NEGATIVE 06/10/2019 2000   HGBUR NEGATIVE 06/10/2019 2000   BILIRUBINUR NEGATIVE 06/10/2019 2000   KETONESUR 80 (A) 06/10/2019 2000   PROTEINUR 100 (A) 06/10/2019 2000   NITRITE NEGATIVE 06/10/2019 2000   LEUKOCYTESUR TRACE (A) 06/10/2019 2000   Sepsis Labs: @LABRCNTIP (procalcitonin:4,lacticidven:4) )No results found for this or any previous visit (from the past 240 hour(s)).   Radiological Exams on Admission: Ct Abdomen Pelvis W Contrast  Result Date: 06/11/2019 CLINICAL DATA:  Abdominal pain. Nausea, vomiting, diarrhea. Bilious vomiting. EXAM: CT ABDOMEN AND PELVIS WITH CONTRAST TECHNIQUE: Multidetector CT imaging of the abdomen and pelvis was performed using the standard protocol following bolus administration of intravenous contrast. CONTRAST:  114mL OMNIPAQUE IOHEXOL 300 MG/ML  SOLN COMPARISON:  None. FINDINGS: Lower chest: Mild hypoventilatory changes at the lung bases. No  pleural fluid. Hepatobiliary: Multiple hypodense liver lesions, many of which are ill-defined, suspicious for metastatic disease. Dominant lesion in the subcapsular right lobe measures 4.1 x 2.9 cm, series 2, image 12. Gallbladder is partially distended, no calcified gallstone. No biliary dilatation. Pancreas: No evidence pancreatic mass. No ductal dilatation or inflammation. Spleen: Normal in size without focal abnormality. Adrenals/Urinary Tract: Mild left adrenal thickening without focal nodule. Normal right adrenal gland. No hydronephrosis or perinephric edema. Homogeneous renal enhancement with symmetric excretion on delayed phase imaging. Small subcentimeter hypodensity in the mid left kidney is too small to characterize but likely cyst. Urinary bladder is physiologically distended without wall thickening. Early excreted contrast in both renal collecting systems in the urinary bladder. Stomach/Bowel: Prominently distended stomach. Dilated fluid-filled small bowel with transition point in the left lower quadrant, series  2, image 53. No obvious small bowel mass or cause of obstruction. More distal small bowel is decompressed. The appendix is normal. Within the proximal transverse colon there is an approximately 4.4 cm segment of irregular low-density colonic wall thickening. Colon distal to this is decompressed and not well evaluated. Mild sigmoid diverticulosis without diverticulitis. Vascular/Lymphatic: Mild aortic atherosclerosis without aneurysm. Portal and mesenteric veins are patent. Suspected prominent pericolonic nodes adjacent irregular transverse wall thickening,, series 2, image 24, however difficult to delineate from adjacent omental stranding. No retroperitoneal adenopathy. No enlarged pelvic lymph nodes. Reproductive: Small enhancing foci anterior to the uterine fundus and posterior to the cervix (series 5, image 59 and series 2, image 72 respectively). Ovaries tentatively visualized and normal. Other: Heterogeneous ill-defined soft tissue density and stranding in the anterior omentum suspicious for omental caking. Small volume abdominopelvic ascites. No free air. Tiny fat containing umbilical hernia. Musculoskeletal: No blastic or destructive lytic lesions. Minor degenerative change in the spine. IMPRESSION: 1. Acute finding of small-bowel obstruction with transition point in the left lower quadrant. 2. More ominous finding of irregular 4.4 cm low-density colonic wall thickening in the proximal transverse colon, highly suspicious for primary colonic malignancy. Adjacent omental stranding and small volume abdominopelvic ascites, consistent with omental caking. Small volume abdominopelvic ascites. Small bowel obstruction may be due to mesenteric implant in this setting. Recommend oncologic workup and colonoscopy for further evaluation of potential colonic lesions. 3. Multiple hypodense liver lesions, many of which are ill-defined, suspicious for metastatic disease. 4. Small enhancing foci in the pelvis anterior to the  uterine fundus and posterior with cervix may represent pelvic soft tissue deposits or less likely fibroids are also considered. Aortic Atherosclerosis (ICD10-I70.0). Electronically Signed   By: Keith Rake M.D.   On: 06/11/2019 00:53      Assessment/Plan Principal Problem:   SBO (small bowel obstruction) (HCC) Active Problems:   Hypokalemia   Weight loss   Suspected Colon cancer metastasized to liver [facility])     #1 small bowel obstruction: Suspected secondary to colon malignancy.  Patient has NG tube inserted.  Has never had colonoscopy.  Surgery to evaluate patient.  IV fluids and supportive care.  #2 suspected colon cancer with metastasis: Patient will require colonoscopy at some point with biopsy.  Supportive care mainly at this point.  #3 hypokalemia: Replete potassium.  #4 unintentional weight loss: Most likely secondary to suspected cancer as well as anorexia.  Nutrition counseling with addressing underlying cause.   DVT prophylaxis: Lovenox Code Status: Full code Family Communication: No family at bedside Disposition Plan: To be determined Consults called: General surgery called by ER Admission status: Inpatient  Severity of Illness: The appropriate  patient status for this patient is INPATIENT. Inpatient status is judged to be reasonable and necessary in order to provide the required intensity of service to ensure the patient's safety. The patient's presenting symptoms, physical exam findings, and initial radiographic and laboratory data in the context of their chronic comorbidities is felt to place them at high risk for further clinical deterioration. Furthermore, it is not anticipated that the patient will be medically stable for discharge from the hospital within 2 midnights of admission. The following factors support the patient status of inpatient.   " The patient's presenting symptoms include nausea vomiting and diarrhea. " The worrisome physical exam findings  include cachexia. " The initial radiographic and laboratory data are worrisome because of CT findings of cancer. " The chronic co-morbidities include none.   * I certify that at the point of admission it is my clinical judgment that the patient will require inpatient hospital care spanning beyond 2 midnights from the point of admission due to high intensity of service, high risk for further deterioration and high frequency of surveillance required.Barbette Merino MD [facility] Pager [phone]  If 7PM-7AM, please contact night-coverage [email or web address] Password [email or web address]  06/11/2019, 1:25 AM

## 2019-06-11 NOTE — Consult Note (Signed)
Reason for Consult: Vomiting Referring Physician: Dr. Jeani Hawking [place] is an 63 y.o. female.  HPI: The patient is a 63 year old white female who presents with a 6-week history of progressive nausea and vomiting with abdominal cramping.  Initially when this started she was also having diarrhea.  She states that she has not passed flatus now for at least a couple of days.  She does not go to doctors and has no idea of her own health history.  She states that she has lost 20 pounds in the last couple months.  Past Medical History:  Diagnosis Date  . Renal disorder     Past Surgical History:  Procedure Laterality Date  . CERVICAL ABLATION    . kidney stone removal      Family History  Problem Relation Age of Onset  . Heart failure Mother   . Kidney failure Mother   . Cancer Mother     Social History:  reports that she has quit smoking. She has never used smokeless tobacco. She reports current alcohol use. She reports previous drug use.  Allergies: No Known Allergies  Medications: I have reviewed the patient's current medications.  Results for orders placed or performed during the hospital encounter of 06/10/19 (from the past 48 hour(s))  Lipase, blood     Status: None   Collection Time: 06/10/19 12:48 PM  Result Value Ref Range   Lipase 22 11 - 51 U/L    Comment: Performed at [facility], [place] [address], [place], [place] [address]  Comprehensive metabolic panel     Status: Abnormal   Collection Time: 06/10/19 12:48 PM  Result Value Ref Range   Sodium 133 (L) 135 - 145 mmol/L    Comment: REPEATED TO VERIFY   Potassium 2.8 (L) 3.5 - 5.1 mmol/L   Chloride 87 (L) 98 - 111 mmol/L    Comment: REPEATED TO VERIFY   CO2 25 22 - 32 mmol/L    Comment: REPEATED TO VERIFY   Glucose, Bld 107 (H) 70 - 99 mg/dL   BUN 12 8 - 23 mg/dL   Creatinine, Ser 0.61 0.44 - 1.00 mg/dL   Calcium 8.9 8.9 - 10.3 mg/dL   Total Protein 7.1 6.5 - 8.1 g/dL   Albumin 3.3 (L)  3.5 - 5.0 g/dL   AST 26 15 - 41 U/L   ALT 25 0 - 44 U/L   Alkaline Phosphatase 145 (H) 38 - 126 U/L   Total Bilirubin 1.0 0.3 - 1.2 mg/dL   GFR calc non Af Amer >60 >60 mL/min   GFR calc Af Amer >60 >60 mL/min   Anion gap 21 (H) 5 - 15    Comment: REPEATED TO VERIFY Performed at [place] [address], [place], [place] [address]   CBC     Status: Abnormal   Collection Time: 06/10/19 12:48 PM  Result Value Ref Range   WBC 12.3 (H) 4.0 - 10.5 K/uL   RBC 4.85 3.87 - 5.11 MIL/uL   Hemoglobin 13.1 12.0 - 15.0 g/dL   HCT 40.2 36.0 - 46.0 %   MCV 82.9 80.0 - 100.0 fL   MCH 27.0 26.0 - 34.0 pg   MCHC 32.6 30.0 - 36.0 g/dL   RDW 12.9 11.5 - 15.5 %   Platelets 440 (H) 150 - 400 K/uL   nRBC 0.0 0.0 - 0.2 %    Comment: Performed at [facility], [place] [address], [place], [place] [address]  Urinalysis, Routine w reflex microscopic     Status: Abnormal   Collection Time: 06/10/19  8:00 PM  Result Value Ref Range   Color, Urine YELLOW YELLOW   APPearance CLOUDY (A) CLEAR   Specific Gravity, Urine 1.024 1.005 - 1.030   pH 5.0 5.0 - 8.0   Glucose, UA NEGATIVE NEGATIVE mg/dL   Hgb urine dipstick NEGATIVE NEGATIVE   Bilirubin Urine NEGATIVE NEGATIVE   Ketones, ur 80 (A) NEGATIVE mg/dL   Protein, ur 100 (A) NEGATIVE mg/dL   Nitrite NEGATIVE NEGATIVE   Leukocytes,Ua TRACE (A) NEGATIVE   RBC / HPF 0-5 0 - 5 RBC/hpf   Bacteria, UA NONE SEEN NONE SEEN   Squamous Epithelial / LPF 0-5 0 - 5   Mucus PRESENT    Hyaline Casts, UA PRESENT     Comment: Performed at [facility], [place] [address], [place], [place] [address]  SARS Coronavirus 2 [facility] order, Performed in [facility] hospital lab) Nasopharyngeal Nasopharyngeal Swab     Status: None   Collection Time: 06/11/19  1:02 AM   Specimen: Nasopharyngeal Swab  Result Value Ref Range   SARS Coronavirus 2 NEGATIVE NEGATIVE    Comment: (NOTE) If result is NEGATIVE SARS-CoV-2 target  nucleic acids are NOT DETECTED. The SARS-CoV-2 RNA is generally detectable in upper and lower  respiratory specimens during the acute phase of infection. The lowest  concentration of SARS-CoV-2 viral copies this assay can detect is 250  copies / mL. A negative result does not preclude SARS-CoV-2 infection  and should not be used as the sole basis for treatment or other  patient management decisions.  A negative result may occur with  improper specimen collection / handling, submission of specimen other  than nasopharyngeal swab, presence of viral mutation(s) within the  areas targeted by this assay, and inadequate number of viral copies  (<250 copies / mL). A negative result must be combined with clinical  observations, patient history, and epidemiological information. If result is POSITIVE SARS-CoV-2 target nucleic acids are DETECTED. The SARS-CoV-2 RNA is generally detectable in upper and lower  respiratory specimens dur ing the acute phase of infection.  Positive  results are indicative of active infection with SARS-CoV-2.  Clinical  correlation with patient history and other diagnostic information is  necessary to determine patient infection status.  Positive results do  not rule out bacterial infection or co-infection with other viruses. If result is PRESUMPTIVE POSTIVE SARS-CoV-2 nucleic acids MAY BE PRESENT.   A presumptive positive result was obtained on the submitted specimen  and confirmed on repeat testing.  While 2019 novel coronavirus  (SARS-CoV-2) nucleic acids may be present in the submitted sample  additional confirmatory testing may be necessary for epidemiological  and / or clinical management purposes  to differentiate between  SARS-CoV-2 and other Sarbecovirus currently known to infect humans.  If clinically indicated additional testing with an alternate test  methodology [phone]) is advised. The SARS-CoV-2 RNA is generally  detectable in upper and lower  respiratory sp ecimens during the acute  phase of infection. The expected result is Negative. Fact Sheet for Patients:  StrictlyIdeas.no Fact Sheet for Healthcare Providers: BankingDealers.co.za This test is not yet approved or cleared by the [place] FDA and has been authorized for detection and/or diagnosis of SARS-CoV-2 by FDA under an Emergency Use Authorization (EUA).  This EUA will remain in effect (meaning this test can be used) for the duration of the COVID-19 declaration under Section 564(b)(1) of  the Act, 21 U.S.C. section 360bbb-3(b)(1), unless the authorization is terminated or revoked sooner. Performed at [facility], [place] [address], [place], [place] [address]   Comprehensive metabolic panel     Status: Abnormal   Collection Time: 06/11/19  6:13 AM  Result Value Ref Range   Sodium 134 (L) 135 - 145 mmol/L   Potassium 3.1 (L) 3.5 - 5.1 mmol/L   Chloride 96 (L) 98 - 111 mmol/L   CO2 20 (L) 22 - 32 mmol/L   Glucose, Bld 146 (H) 70 - 99 mg/dL   BUN 7 (L) 8 - 23 mg/dL   Creatinine, Ser 0.44 0.44 - 1.00 mg/dL   Calcium 8.2 (L) 8.9 - 10.3 mg/dL   Total Protein 6.0 (L) 6.5 - 8.1 g/dL   Albumin 2.7 (L) 3.5 - 5.0 g/dL   AST 19 15 - 41 U/L   ALT 19 0 - 44 U/L   Alkaline Phosphatase 123 38 - 126 U/L   Total Bilirubin 1.0 0.3 - 1.2 mg/dL   GFR calc non Af Amer >60 >60 mL/min   GFR calc Af Amer >60 >60 mL/min   Anion gap 18 (H) 5 - 15    Comment: Performed at [facility], [place] [address] [place], [place] [address]  CBC     Status: Abnormal   Collection Time: 06/11/19  6:13 AM  Result Value Ref Range   WBC 11.5 (H) 4.0 - 10.5 K/uL   RBC 4.37 3.87 - 5.11 MIL/uL   Hemoglobin 12.0 12.0 - 15.0 g/dL   HCT 36.6 36.0 - 46.0 %   MCV 83.8 80.0 - 100.0 fL   MCH 27.5 26.0 - 34.0 pg   MCHC 32.8 30.0 - 36.0 g/dL   RDW 13.0 11.5 - 15.5 %   Platelets 361 150 - 400 K/uL   nRBC 0.0 0.0 - 0.2 %     Comment: Performed at [facility], [place] [address], [place], [place] [address]    Ct Abdomen Pelvis W Contrast  Result Date: 06/11/2019 CLINICAL DATA:  Abdominal pain. Nausea, vomiting, diarrhea. Bilious vomiting. EXAM: CT ABDOMEN AND PELVIS WITH CONTRAST TECHNIQUE: Multidetector CT imaging of the abdomen and pelvis was performed using the standard protocol following bolus administration of intravenous contrast. CONTRAST:  159mL OMNIPAQUE IOHEXOL 300 MG/ML  SOLN COMPARISON:  None. FINDINGS: Lower chest: Mild hypoventilatory changes at the lung bases. No pleural fluid. Hepatobiliary: Multiple hypodense liver lesions, many of which are ill-defined, suspicious for metastatic disease. Dominant lesion in the subcapsular right lobe measures 4.1 x 2.9 cm, series 2, image 12. Gallbladder is partially distended, no calcified gallstone. No biliary dilatation. Pancreas: No evidence pancreatic mass. No ductal dilatation or inflammation. Spleen: Normal in size without focal abnormality. Adrenals/Urinary Tract: Mild left adrenal thickening without focal nodule. Normal right adrenal gland. No hydronephrosis or perinephric edema. Homogeneous renal enhancement with symmetric excretion on delayed phase imaging. Small subcentimeter hypodensity in the mid left kidney is too small to characterize but likely cyst. Urinary bladder is physiologically distended without wall thickening. Early excreted contrast in both renal collecting systems in the urinary bladder. Stomach/Bowel: Prominently distended stomach. Dilated fluid-filled small bowel with transition point in the left lower quadrant, series 2, image 53. No obvious small bowel mass or cause of obstruction. More distal small bowel is decompressed. The appendix is normal. Within the proximal transverse colon there is an approximately 4.4 cm segment of irregular low-density colonic wall thickening. Colon distal to this is  decompressed and not well evaluated.  Mild sigmoid diverticulosis without diverticulitis. Vascular/Lymphatic: Mild aortic atherosclerosis without aneurysm. Portal and mesenteric veins are patent. Suspected prominent pericolonic nodes adjacent irregular transverse wall thickening,, series 2, image 24, however difficult to delineate from adjacent omental stranding. No retroperitoneal adenopathy. No enlarged pelvic lymph nodes. Reproductive: Small enhancing foci anterior to the uterine fundus and posterior to the cervix (series 5, image 59 and series 2, image 72 respectively). Ovaries tentatively visualized and normal. Other: Heterogeneous ill-defined soft tissue density and stranding in the anterior omentum suspicious for omental caking. Small volume abdominopelvic ascites. No free air. Tiny fat containing umbilical hernia. Musculoskeletal: No blastic or destructive lytic lesions. Minor degenerative change in the spine. IMPRESSION: 1. Acute finding of small-bowel obstruction with transition point in the left lower quadrant. 2. More ominous finding of irregular 4.4 cm low-density colonic wall thickening in the proximal transverse colon, highly suspicious for primary colonic malignancy. Adjacent omental stranding and small volume abdominopelvic ascites, consistent with omental caking. Small volume abdominopelvic ascites. Small bowel obstruction may be due to mesenteric implant in this setting. Recommend oncologic workup and colonoscopy for further evaluation of potential colonic lesions. 3. Multiple hypodense liver lesions, many of which are ill-defined, suspicious for metastatic disease. 4. Small enhancing foci in the pelvis anterior to the uterine fundus and posterior with cervix may represent pelvic soft tissue deposits or less likely fibroids are also considered. Aortic Atherosclerosis (ICD10-I70.0). Electronically Signed   By: Keith Rake M.D.   On: 06/11/2019 00:53    Review of Systems  Constitutional: Positive for weight loss.  HENT:  Negative.   Eyes: Negative.   Respiratory: Negative.   Cardiovascular: Negative.   Gastrointestinal: Positive for abdominal pain, nausea and vomiting.  Genitourinary: Negative.   Musculoskeletal: Negative.   Skin: Negative.   Neurological: Negative.   Endo/Heme/Allergies: Negative.   Psychiatric/Behavioral: Negative.    Blood pressure (!) 143/79, pulse 97, temperature 99.2 F (37.3 C), temperature source Oral, resp. rate 18, height 5\' 3"  (1.6 m), weight 49 kg, SpO2 98 %. Physical Exam  Constitutional: She is oriented to person, place, and time. She appears well-developed and well-nourished. No distress.  HENT:  Head: Normocephalic and atraumatic.  Mouth/Throat: No oropharyngeal exudate.  Eyes: Pupils are equal, round, and reactive to light. Conjunctivae and EOM are normal.  Neck: Normal range of motion. Neck supple.  Cardiovascular: Normal rate, regular rhythm and normal heart sounds.  Respiratory: Effort normal and breath sounds normal. No stridor. No respiratory distress.  GI: Soft. Bowel sounds are normal.  There is minimal tenderness and only mild distention.  Musculoskeletal: Normal range of motion.        General: No tenderness or edema.  Neurological: She is alert and oriented to person, place, and time. Coordination normal.  Skin: Skin is warm and dry. No rash noted.  Psychiatric: She has a normal mood and affect. Her behavior is normal. Thought content normal.    Assessment/Plan: The patient appears to have a small bowel obstruction with no previous history of abdominal surgery.  She also has what appears to be a mass in the transverse colon as well as omental caking with multiple masses in the liver.  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  We will plan to check a CEA level today.  I suspect that she will require surgery in the near future for the obstruction.  If this is cancer I have told her  that  this would not be curable but may be manageable for a period of time.  She would likely require a diverting colostomy if it is even possible to work in the abdominal cavity.  We will hydrate her.  I think she would benefit from starting on TPN.  We will likely plan for definitive surgery on Monday morning and we will follow her closely with you.  Autumn Messing III 06/11/2019, 8:42 AM

## 2019-06-11 NOTE — ED Provider Notes (Signed)
Discussed with Dr. Jonelle Sidle (hospitalist).  Will admit.  Given nausea/vomiting, NG tube placed.  Discussed patient with Dr. Marcello Moores (Gen Surg).  We will plan to have surgery consult on her tomorrow morning.  Portions of this note were generated with Lobbyist. Dictation errors may occur despite best attempts at proofreading.     Volanda Napoleon, PA-C 06/11/19 0215    Fredia Sorrow, MD 06/25/19 [phone]

## 2019-06-11 NOTE — Progress Notes (Signed)
Spoke with RN re PICC for TNA.  PICC to be placed 06/12/19 am.  RN states PIV working well and is agreeable with current plan.

## 2019-06-11 NOTE — ED Notes (Signed)
Two unsuccessful attempts made to place NG tube, patient was unable to tolerate.

## 2019-06-11 NOTE — ED Notes (Signed)
Patient ambulated to restroom with standby assist.  

## 2019-06-12 ENCOUNTER — Inpatient Hospital Stay (HOSPITAL_COMMUNITY): Payer: Medicare Other

## 2019-06-12 DIAGNOSIS — E871 Hypo-osmolality and hyponatremia: Secondary | ICD-10-CM

## 2019-06-12 LAB — COMPREHENSIVE METABOLIC PANEL
ALT: 17 U/L (ref 0–44)
AST: 19 U/L (ref 15–41)
Albumin: 2.7 g/dL — ABNORMAL LOW (ref 3.5–5.0)
Alkaline Phosphatase: 112 U/L (ref 38–126)
Anion gap: 13 (ref 5–15)
BUN: <5 mg/dL — ABNORMAL LOW (ref 8–23)
CO2: 25 mmol/L (ref 22–32)
Calcium: 8.5 mg/dL — ABNORMAL LOW (ref 8.9–10.3)
Chloride: 100 mmol/L (ref 98–111)
Creatinine, Ser: 0.47 mg/dL (ref 0.44–1.00)
GFR calc Af Amer: >60 mL/min (ref >60)
GFR calc non Af Amer: >60 mL/min (ref >60)
Glucose, Bld: 137 mg/dL — ABNORMAL HIGH (ref 70–99)
Potassium: 3.5 mmol/L (ref 3.5–5.1)
Sodium: 138 mmol/L (ref 135–145)
Total Bilirubin: 0.4 mg/dL (ref 0.3–1.2)
Total Protein: 5.9 g/dL — ABNORMAL LOW (ref 6.5–8.1)

## 2019-06-12 LAB — CBC WITH DIFFERENTIAL/PLATELET
Abs Immature Granulocytes: 0.05 10*3/uL (ref 0.00–0.07)
Basophils Absolute: 0 10*3/uL (ref 0.0–0.1)
Basophils Relative: 0 %
Eosinophils Absolute: 0.3 10*3/uL (ref 0.0–0.5)
Eosinophils Relative: 3 %
HCT: 38.9 % (ref 36.0–46.0)
Hemoglobin: 12.3 g/dL (ref 12.0–15.0)
Immature Granulocytes: 1 %
Lymphocytes Relative: 14 %
Lymphs Abs: 1.5 10*3/uL (ref 0.7–4.0)
MCH: 26.7 pg (ref 26.0–34.0)
MCHC: 31.6 g/dL (ref 30.0–36.0)
MCV: 84.6 fL (ref 80.0–100.0)
Monocytes Absolute: 1.2 10*3/uL — ABNORMAL HIGH (ref 0.1–1.0)
Monocytes Relative: 12 %
Neutro Abs: 7.5 10*3/uL (ref 1.7–7.7)
Neutrophils Relative %: 70 %
Platelets: 414 10*3/uL — ABNORMAL HIGH (ref 150–400)
RBC: 4.6 MIL/uL (ref 3.87–5.11)
RDW: 13.1 % (ref 11.5–15.5)
WBC: 10.7 10*3/uL — ABNORMAL HIGH (ref 4.0–10.5)
nRBC: 0 % (ref 0.0–0.2)

## 2019-06-12 LAB — MAGNESIUM: Magnesium: 1.7 mg/dL (ref 1.7–2.4)

## 2019-06-12 LAB — PHOSPHORUS: Phosphorus: 2 mg/dL — ABNORMAL LOW (ref 2.5–4.6)

## 2019-06-12 MED ORDER — POTASSIUM PHOSPHATES 15 MMOLE/5ML IV SOLN
20.0000 mmol | Freq: Once | INTRAVENOUS | Status: AC
  Administered 2019-06-12: 11:00:00 20 mmol via INTRAVENOUS
  Filled 2019-06-12: qty 6.67

## 2019-06-12 MED ORDER — MAGNESIUM SULFATE 2 GM/50ML IV SOLN
2.0000 g | Freq: Once | INTRAVENOUS | Status: AC
  Administered 2019-06-12: 10:00:00 2 g via INTRAVENOUS
  Filled 2019-06-12: qty 50

## 2019-06-12 NOTE — Plan of Care (Signed)
  Problem: Clinical Measurements: Goal: Diagnostic test results will improve Outcome: Progressing   Problem: Clinical Measurements: Goal: Respiratory complications will improve Outcome: Progressing   Problem: Clinical Measurements: Goal: Cardiovascular complication will be avoided Outcome: Progressing   Problem: Activity: Goal: Risk for activity intolerance will decrease Outcome: Progressing

## 2019-06-12 NOTE — Progress Notes (Signed)
Initial Nutrition Assessment  RD working remotely.   DOCUMENTATION CODES:   (unable to assess for malnutrition at this time.)  INTERVENTION:  - diet advancement as medically feasible. - initiation of TPN.    NUTRITION DIAGNOSIS:   Inadequate oral intake related to inability to eat as evidenced by NPO status.  GOAL:   Patient will meet greater than or equal to 90% of their needs  MONITOR:   Diet advancement, Labs, Weight trends  REASON FOR ASSESSMENT:   Malnutrition Screening Tool  ASSESSMENT:   63 y.o. female with no significant past medical history mainly previous renal disease. She presented to the ED on 9/11 with N/V, abdominal pain, and significant weight loss over the past 6 weeks. She reported symptoms have been acutely worse. Patient reported not seeing a doctor in over 20 years. In the ED she was found to have SBO and possible colon malignancy with mets. She has been mainly having loose stools, no formed stools, but not overt diarrhea. Surgery consulted.  Patient has been NPO since admission. Patient reports that over the past 6 weeks she has had progressive abdominal pain and N/V. She has been unable to keep anything down for the few days PTA and prior to that had poor appetite with decreased intakes from her baseline. She reports 20 lb weight loss in the past 6 weeks. Current weight is 108 lb and no other weight recordings in the chart. This would indicate 15.6% body weight in the past 6 weeks; significant for time frame. Unable to state malnutrition without completing NFPE, but patient likely meets criteria for some degree of malnutrition.  Surgery following and note from yesterday AM indicates that patient appears to have SBO and colon cancer with peritoneal carcinomatosis and liver mets. Note indicates patient will likely require surgery in the near future (likely 9/14).   Labs reviewed; BUN: <5 mg/dl, Ca: 8.5 mg/dl, Phos: 2 mg/dl. Medications reviewed; 10 mEq IV  KCl x4 runs 9/12. IVF; D5-NS-40 mEq KCl @ 75 ml/hr (306 kcal).     NUTRITION - FOCUSED PHYSICAL EXAM:  unable to complete at this time.   Diet Order:   Diet Order            Diet NPO time specified  Diet effective now              EDUCATION NEEDS:   Not appropriate for education at this time  Skin:  Skin Assessment: Reviewed RN Assessment  Last BM:  9/10 (day PTA, per patient)  Height:   Ht Readings from Last 1 Encounters:  06/10/19 5\' 3"  (1.6 m)    Weight:   Wt Readings from Last 1 Encounters:  06/10/19 49 kg    Ideal Body Weight:  52.3 kg  BMI:  Body mass index is 19.13 kg/m.  Estimated Nutritional Needs:   Kcal:  1715-1960 kcal  Protein:  78-88 grams  Fluid:  >/= 1.8 L/day      Jarome Matin, MS, RD, LDN, [facility] Inpatient Clinical Dietitian Pager # [phone] After hours/weekend pager # [phone]

## 2019-06-12 NOTE — Progress Notes (Addendum)
PROGRESS NOTE    Ellen Larson  [id number] DOB: [birth year] DOA: 06/10/2019 PCP: Patient, No Pcp Per   Brief Narrative:  Patient is a 63 year old Caucasian female with a past medical history of renal disease and no other past medical history presented to the ED with nausea vomiting and abdominal pain as well as unexplained significant weight loss.  She is unable to keep her food down next extensively and has been having intermittent nausea vomiting over the last 6 weeks which is gotten worse.  She has not seen a doctor in over 20 years as well.  She was seen in outpatient setting for an umbilical hernia referred to general surgery and sent to the ER based on her history.  In the ER she was evaluated and found to have a small bowel obstruction and a colonic malignancy likely with metastasis.  She has been having loose stools mainly with small formed stools and last bowel movement was yesterday.  She was evaluated and admitted for small bowel obstruction and general surgery was consulted.  Small bowel obstruction is likely secondary to her colonic malignancy and an NG tube was to be inserted but I do not think one was as when I walked in the room she did not have 1.  General surgery evaluated and she is currently n.p.o. and having IV fluids and supportive care.  Dr. 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  He feels that she will likely require diverting colostomy if it is even possible to work an abdominal cavity and she will be started on TPN once PICC is placed with tentative plans for exploratory lap on Monday morning  Assessment & Plan:   Principal Problem:   SBO (small bowel obstruction) ([place]) Active Problems:   Hypokalemia   Weight loss   Suspected Colon cancer metastasized to  liver ([place])  Small Bowel Obstruction, persistent  -Suspected secondary to colon malignancy.  -Has never had colonoscopy. Surgery to evaluate patient.  -C/w IV fluids and supportive care. -Continue with antiemetics with ondansetron 4 mg p.o./IV every 6 as needed for nausea and pain control with IV morphine 2 g every 4 PRN for severe pain -We will place PICC line and start TPN based on general surgery recommendations and PICC is still to be placed -Repeat KUB this AM showed "Some gas and stool are noted in the colon and distal rectum. However, there continue to be multiple dilated loops of small bowel in the central abdomen measuring up to 5.1 cm in diameter, compatible with persistent partial small bowel obstruction. No gross evidence of pneumoperitoneum on this single supine view."  This is compatible with a persistent SBO -Appreciate further surgical care  Suspected Colon Cancer with Metastasis -Patient will require colonoscopy at some point with biopsy but after discussion with GI and general surgery she is going to go for surgical intervention on Monday with an Exploratory Lap -Supportive care mainly at this point.  Hypokalemia -Potassium on admission was 2.8 and has now improved to 3.5 -Replete with IV KPhos 20 mmol and with D5 normal saline +40 mEq at a rate of 75 mL's per hour now -Continue to monitor and replete as necessary -Repeat CMP in a.m.  Hypophosphatemia -Patient's Phos Level this AM was 2.0 -Replete with IV K Phos 20 mmol -Continue  to Monitor and Replete as Necessary -Repeat Phos Level in AM   Unintentional weight loss -Most likely secondary to suspected cancer as well as anorexia.  -Nutrition counseling with addressing underlying cause. -We will place a PICC line for TPN and will call dietitian for further evaluation recommendations -In the interim continue with D5 Normal Saline +40 mEq of KCl at 75 mL's per hour  Hyponatremia/Hypochloremia, improved  -In  setting of poor p.o. intake -Continue with supportive care and IV fluid hydration with D5 normal saline +40 mEq of KCl at 125/h and will reduce it down to 75 mL's per hour -Patient's sodium is now improving to 138 and chloride has gone from 87 is now 100 -Repeat CMP in the a.m.  Anion Gap Metabolic Acidosis -Continue with supportive care and IV fluids as above; Now improved  -Patient CO2 is now 25 and anion gap is 13 -C/w IVF hydration  -Continue to monitor and trend and repeat CMP in a.m.  Leukocytosis -Improving. WBC went from 12.3 -> 11.5 -> 10.7 -Likely from SBO -Continue to Monitor for S/Sx of Infection -Repeat CBC in AM   Thrombocytosis -Patient's Platelet Count went to 414 -Likely Reactive -Continue to Monitor and Trend -Repeat CBC in AM   DVT prophylaxis: Heparin 5,000 units sq q8h Code Status: FULL CODE Family Communication: No family present at bedside  Disposition Plan: Pending further Surgical Evaluation and Recc's  Consultants:   General Surgery   Procedures: None  Antimicrobials:  Anti-infectives (From admission, onward)   None     Subjective: Seen and examined at bedside she is resting and states that she was doing okay and had some vomiting yesterday but none today.  Slightly nauseous this morning.  No chest pain, lightheadedness or dizziness.  No other concerns or complaints at this time asking about when she is going for surgery in the morning.  Objective: Vitals:   06/11/19 0529 06/11/19 1314 06/11/19 2119 06/12/19 0558  BP: (!) 143/79 124/73 118/66 121/65  Pulse: 97 91 89 83  Resp: 18 16 16 17   Temp: 99.2 F (37.3 C) 98.2 F (36.8 C) 99 F (37.2 C) 98.9 F (37.2 C)  TempSrc: Oral Oral Oral Oral  SpO2: 98% 98% 97% 97%  Weight:      Height:        Intake/Output Summary (Last 24 hours) at 06/12/2019 1236 Last data filed at 06/12/2019 1226 Gross per 24 hour  Intake 2417.13 ml  Output --  Net 2417.13 ml   Filed Weights   06/10/19 1208   Weight: 49 kg   Examination: Physical Exam:  Constitutional: Thin Caucasian female in NAD and appears calm and comfortable Eyes: Lids and conjunctivae normal, sclerae anicteric  ENMT: External Ears, Nose appear normal. Grossly normal hearing. Neck: Appears normal, supple, no cervical masses, normal ROM, no appreciable thyromegaly; no JVD Respiratory: Clear to auscultation bilaterally, no wheezing, rales, rhonchi or crackles. Normal respiratory effort and patient is not tachypenic. No accessory muscle use.  Cardiovascular: RRR, no murmurs / rubs / gallops. S1 and S2 auscultated. No extremity edema Abdomen: Soft, non-tender, mildly-distended.  Bowel sounds are diminished  GU: Deferred. Musculoskeletal: No clubbing / cyanosis of digits/nails. No joint deformity upper and lower extremities.  Skin: No rashes, lesions, ulcers on a limited skin evaluation. No induration; Warm and dry.  Neurologic: CN 2-12 grossly intact with no focal deficits. Romberg sign and cerebellar reflexes not assessed.  Psychiatric: Normal judgment and insight. Alert and oriented x 3. Normal mood and  appropriate affect.   Data Reviewed: I have personally reviewed following labs and imaging studies  CBC: Recent Labs  Lab 06/10/19 1248 06/11/19 0613 06/12/19 0324  WBC 12.3* 11.5* 10.7*  NEUTROABS  --   --  7.5  HGB 13.1 12.0 12.3  HCT 40.2 36.6 38.9  MCV 82.9 83.8 84.6  PLT 440* 361 [id number]*   Basic Metabolic Panel: Recent Labs  Lab 06/10/19 1248 06/11/19 0613 06/12/19 0324  NA 133* 134* 138  K 2.8* 3.1* 3.5  CL 87* 96* 100  CO2 25 20* 25  GLUCOSE 107* 146* 137*  BUN 12 7* <5*  CREATININE 0.61 0.44 0.47  CALCIUM 8.9 8.2* 8.5*  MG  --  1.8 1.7  PHOS  --  2.7 2.0*   GFR: Estimated Creatinine Clearance: 54.2 mL/min (by C-G formula based on SCr of 0.47 mg/dL). Liver Function Tests: Recent Labs  Lab 06/10/19 1248 06/11/19 0613 06/12/19 0324  AST 26 19 19   ALT 25 19 17   ALKPHOS 145* 123 112   BILITOT 1.0 1.0 0.4  PROT 7.1 6.0* 5.9*  ALBUMIN 3.3* 2.7* 2.7*   Recent Labs  Lab 06/10/19 1248  LIPASE 22   No results for input(s): AMMONIA in the last 168 hours. Coagulation Profile: No results for input(s): INR, PROTIME in the last 168 hours. Cardiac Enzymes: No results for input(s): CKTOTAL, CKMB, CKMBINDEX, TROPONINI in the last 168 hours. BNP (last 3 results) No results for input(s): PROBNP in the last 8760 hours. HbA1C: No results for input(s): HGBA1C in the last 72 hours. CBG: No results for input(s): GLUCAP in the last 168 hours. Lipid Profile: No results for input(s): CHOL, HDL, LDLCALC, TRIG, CHOLHDL, LDLDIRECT in the last 72 hours. Thyroid Function Tests: No results for input(s): TSH, T4TOTAL, FREET4, T3FREE, THYROIDAB in the last 72 hours. Anemia Panel: No results for input(s): VITAMINB12, FOLATE, FERRITIN, TIBC, IRON, RETICCTPCT in the last 72 hours. Sepsis Labs: No results for input(s): PROCALCITON, LATICACIDVEN in the last 168 hours.  Recent Results (from the past 240 hour(s))  SARS Coronavirus 2 [facility] order, Performed in [facility] hospital lab) Nasopharyngeal Nasopharyngeal Swab     Status: None   Collection Time: 06/11/19  1:02 AM   Specimen: Nasopharyngeal Swab  Result Value Ref Range Status   SARS Coronavirus 2 NEGATIVE NEGATIVE Final    Comment: (NOTE) If result is NEGATIVE SARS-CoV-2 target nucleic acids are NOT DETECTED. The SARS-CoV-2 RNA is generally detectable in upper and lower  respiratory specimens during the acute phase of infection. The lowest  concentration of SARS-CoV-2 viral copies this assay can detect is 250  copies / mL. A negative result does not preclude SARS-CoV-2 infection  and should not be used as the sole basis for treatment or other  patient management decisions.  A negative result may occur with  improper specimen collection / handling, submission of specimen other  than nasopharyngeal swab, presence of viral  mutation(s) within the  areas targeted by this assay, and inadequate number of viral copies  (<250 copies / mL). A negative result must be combined with clinical  observations, patient history, and epidemiological information. If result is POSITIVE SARS-CoV-2 target nucleic acids are DETECTED. The SARS-CoV-2 RNA is generally detectable in upper and lower  respiratory specimens dur ing the acute phase of infection.  Positive  results are indicative of active infection with SARS-CoV-2.  Clinical  correlation with patient history and other diagnostic information is  necessary to determine patient infection status.  Positive results  do  not rule out bacterial infection or co-infection with other viruses. If result is PRESUMPTIVE POSTIVE SARS-CoV-2 nucleic acids MAY BE PRESENT.   A presumptive positive result was obtained on the submitted specimen  and confirmed on repeat testing.  While 2019 novel coronavirus  (SARS-CoV-2) nucleic acids may be present in the submitted sample  additional confirmatory testing may be necessary for epidemiological  and / or clinical management purposes  to differentiate between  SARS-CoV-2 and other Sarbecovirus currently known to infect humans.  If clinically indicated additional testing with an alternate test  methodology [phone]) is advised. The SARS-CoV-2 RNA is generally  detectable in upper and lower respiratory sp ecimens during the acute  phase of infection. The expected result is Negative. Fact Sheet for Patients:  StrictlyIdeas.no Fact Sheet for Healthcare Providers: BankingDealers.co.za This test is not yet approved or cleared by the [place] FDA and has been authorized for detection and/or diagnosis of SARS-CoV-2 by FDA under an Emergency Use Authorization (EUA).  This EUA will remain in effect (meaning this test can be used) for the duration of the COVID-19 declaration under Section 564(b)(1)  of the Act, 21 U.S.C. section 360bbb-3(b)(1), unless the authorization is terminated or revoked sooner. Performed at [facility], [place] [address] [place], [place] [address]     Radiology Studies: Dg Abd 1 View  Result Date: 06/12/2019 CLINICAL DATA:  63 year old female with history of abdominal pain. EXAM: ABDOMEN - 1 VIEW COMPARISON:  Abdominal radiograph 06/01/2019. FINDINGS: Some gas and stool are noted in the colon and distal rectum. However, there continue to be multiple dilated loops of small bowel in the central abdomen measuring up to 5.1 cm in diameter, compatible with persistent partial small bowel obstruction. No gross evidence of pneumoperitoneum on this single supine view. IMPRESSION: 1. Findings are compatible with persistent partial small bowel obstruction. Electronically Signed   By: Vinnie Langton M.D.   On: 06/12/2019 04:52   Ct Abdomen Pelvis W Contrast  Result Date: 06/11/2019 CLINICAL DATA:  Abdominal pain. Nausea, vomiting, diarrhea. Bilious vomiting. EXAM: CT ABDOMEN AND PELVIS WITH CONTRAST TECHNIQUE: Multidetector CT imaging of the abdomen and pelvis was performed using the standard protocol following bolus administration of intravenous contrast. CONTRAST:  160mL OMNIPAQUE IOHEXOL 300 MG/ML  SOLN COMPARISON:  None. FINDINGS: Lower chest: Mild hypoventilatory changes at the lung bases. No pleural fluid. Hepatobiliary: Multiple hypodense liver lesions, many of which are ill-defined, suspicious for metastatic disease. Dominant lesion in the subcapsular right lobe measures 4.1 x 2.9 cm, series 2, image 12. Gallbladder is partially distended, no calcified gallstone. No biliary dilatation. Pancreas: No evidence pancreatic mass. No ductal dilatation or inflammation. Spleen: Normal in size without focal abnormality. Adrenals/Urinary Tract: Mild left adrenal thickening without focal nodule. Normal right adrenal gland. No hydronephrosis or perinephric edema.  Homogeneous renal enhancement with symmetric excretion on delayed phase imaging. Small subcentimeter hypodensity in the mid left kidney is too small to characterize but likely cyst. Urinary bladder is physiologically distended without wall thickening. Early excreted contrast in both renal collecting systems in the urinary bladder. Stomach/Bowel: Prominently distended stomach. Dilated fluid-filled small bowel with transition point in the left lower quadrant, series 2, image 53. No obvious small bowel mass or cause of obstruction. More distal small bowel is decompressed. The appendix is normal. Within the proximal transverse colon there is an approximately 4.4 cm segment of irregular low-density colonic wall thickening. Colon distal to this is decompressed and not well evaluated. Mild sigmoid diverticulosis  without diverticulitis. Vascular/Lymphatic: Mild aortic atherosclerosis without aneurysm. Portal and mesenteric veins are patent. Suspected prominent pericolonic nodes adjacent irregular transverse wall thickening,, series 2, image 24, however difficult to delineate from adjacent omental stranding. No retroperitoneal adenopathy. No enlarged pelvic lymph nodes. Reproductive: Small enhancing foci anterior to the uterine fundus and posterior to the cervix (series 5, image 59 and series 2, image 72 respectively). Ovaries tentatively visualized and normal. Other: Heterogeneous ill-defined soft tissue density and stranding in the anterior omentum suspicious for omental caking. Small volume abdominopelvic ascites. No free air. Tiny fat containing umbilical hernia. Musculoskeletal: No blastic or destructive lytic lesions. Minor degenerative change in the spine. IMPRESSION: 1. Acute finding of small-bowel obstruction with transition point in the left lower quadrant. 2. More ominous finding of irregular 4.4 cm low-density colonic wall thickening in the proximal transverse colon, highly suspicious for primary colonic  malignancy. Adjacent omental stranding and small volume abdominopelvic ascites, consistent with omental caking. Small volume abdominopelvic ascites. Small bowel obstruction may be due to mesenteric implant in this setting. Recommend oncologic workup and colonoscopy for further evaluation of potential colonic lesions. 3. Multiple hypodense liver lesions, many of which are ill-defined, suspicious for metastatic disease. 4. Small enhancing foci in the pelvis anterior to the uterine fundus and posterior with cervix may represent pelvic soft tissue deposits or less likely fibroids are also considered. Aortic Atherosclerosis (ICD10-I70.0). Electronically Signed   By: Keith Rake M.D.   On: 06/11/2019 00:53   [place] Ekg Site Rite  Result Date: 06/11/2019 If Site Rite image not attached, placement could not be confirmed due to current cardiac rhythm.  Scheduled Meds:  heparin  5,000 Units Subcutaneous Q8H   sodium chloride flush  3 mL Intravenous Once   Continuous Infusions:  dextrose 5 % and 0.9 % NaCl with KCl 40 mEq/L Stopped (06/12/19 1117)   potassium PHOSPHATE IVPB (in mmol) 85 mL/hr at 06/12/19 1226    LOS: 1 day   Kerney Elbe, DO [facility] PAGER is on AMION  If 7PM-7AM, please contact night-coverage [email or web address] Password [facility] 06/12/2019, 12:36 PM

## 2019-06-12 NOTE — Progress Notes (Signed)
Subjective/Chief Complaint: No complaints. xrays show persistent sbo   Objective: Vital signs in last 24 hours: Temp:  [98.2 F (36.8 C)-99 F (37.2 C)] 98.9 F (37.2 C) (09/13 0558) Pulse Rate:  [83-91] 83 (09/13 0558) Resp:  [16-17] 17 (09/13 0558) BP: (118-124)/(65-73) 121/65 (09/13 0558) SpO2:  [97 %-98 %] 97 % (09/13 0558) Last BM Date: 06/05/19  Intake/Output from previous day: 09/12 0701 - 09/13 0700 In: 1649.3 [I.V.:1649.3] Out: -  Intake/Output this shift: No intake/output data recorded.  General appearance: alert and cooperative Resp: clear to auscultation bilaterally Cardio: regular rate and rhythm GI: soft, minimal tenderness. minimal distension  Lab Results:  Recent Labs    06/11/19 0613 06/12/19 0324  WBC 11.5* 10.7*  HGB 12.0 12.3  HCT 36.6 38.9  PLT 361 414*   BMET Recent Labs    06/11/19 0613 06/12/19 0324  NA 134* 138  K 3.1* 3.5  CL 96* 100  CO2 20* 25  GLUCOSE 146* 137*  BUN 7* <5*  CREATININE 0.44 0.47  CALCIUM 8.2* 8.5*   PT/INR No results for input(s): LABPROT, INR in the last 72 hours. ABG No results for input(s): PHART, HCO3 in the last 72 hours.  Invalid input(s): PCO2, PO2  Studies/Results: Dg Abd 1 View  Result Date: 06/12/2019 CLINICAL DATA:  63 year old female with history of abdominal pain. EXAM: ABDOMEN - 1 VIEW COMPARISON:  Abdominal radiograph 06/01/2019. FINDINGS: Some gas and stool are noted in the colon and distal rectum. However, there continue to be multiple dilated loops of small bowel in the central abdomen measuring up to 5.1 cm in diameter, compatible with persistent partial small bowel obstruction. No gross evidence of pneumoperitoneum on this single supine view. IMPRESSION: 1. Findings are compatible with persistent partial small bowel obstruction. Electronically Signed   By: Vinnie Langton M.D.   On: 06/12/2019 04:52   Ct Abdomen Pelvis W Contrast  Result Date: 06/11/2019 CLINICAL DATA:  Abdominal  pain. Nausea, vomiting, diarrhea. Bilious vomiting. EXAM: CT ABDOMEN AND PELVIS WITH CONTRAST TECHNIQUE: Multidetector CT imaging of the abdomen and pelvis was performed using the standard protocol following bolus administration of intravenous contrast. CONTRAST:  153mL OMNIPAQUE IOHEXOL 300 MG/ML  SOLN COMPARISON:  None. FINDINGS: Lower chest: Mild hypoventilatory changes at the lung bases. No pleural fluid. Hepatobiliary: Multiple hypodense liver lesions, many of which are ill-defined, suspicious for metastatic disease. Dominant lesion in the subcapsular right lobe measures 4.1 x 2.9 cm, series 2, image 12. Gallbladder is partially distended, no calcified gallstone. No biliary dilatation. Pancreas: No evidence pancreatic mass. No ductal dilatation or inflammation. Spleen: Normal in size without focal abnormality. Adrenals/Urinary Tract: Mild left adrenal thickening without focal nodule. Normal right adrenal gland. No hydronephrosis or perinephric edema. Homogeneous renal enhancement with symmetric excretion on delayed phase imaging. Small subcentimeter hypodensity in the mid left kidney is too small to characterize but likely cyst. Urinary bladder is physiologically distended without wall thickening. Early excreted contrast in both renal collecting systems in the urinary bladder. Stomach/Bowel: Prominently distended stomach. Dilated fluid-filled small bowel with transition point in the left lower quadrant, series 2, image 53. No obvious small bowel mass or cause of obstruction. More distal small bowel is decompressed. The appendix is normal. Within the proximal transverse colon there is an approximately 4.4 cm segment of irregular low-density colonic wall thickening. Colon distal to this is decompressed and not well evaluated. Mild sigmoid diverticulosis without diverticulitis. Vascular/Lymphatic: Mild aortic atherosclerosis without aneurysm. Portal and mesenteric veins are patent.  Suspected prominent pericolonic  nodes adjacent irregular transverse wall thickening,, series 2, image 24, however difficult to delineate from adjacent omental stranding. No retroperitoneal adenopathy. No enlarged pelvic lymph nodes. Reproductive: Small enhancing foci anterior to the uterine fundus and posterior to the cervix (series 5, image 59 and series 2, image 72 respectively). Ovaries tentatively visualized and normal. Other: Heterogeneous ill-defined soft tissue density and stranding in the anterior omentum suspicious for omental caking. Small volume abdominopelvic ascites. No free air. Tiny fat containing umbilical hernia. Musculoskeletal: No blastic or destructive lytic lesions. Minor degenerative change in the spine. IMPRESSION: 1. Acute finding of small-bowel obstruction with transition point in the left lower quadrant. 2. More ominous finding of irregular 4.4 cm low-density colonic wall thickening in the proximal transverse colon, highly suspicious for primary colonic malignancy. Adjacent omental stranding and small volume abdominopelvic ascites, consistent with omental caking. Small volume abdominopelvic ascites. Small bowel obstruction may be due to mesenteric implant in this setting. Recommend oncologic workup and colonoscopy for further evaluation of potential colonic lesions. 3. Multiple hypodense liver lesions, many of which are ill-defined, suspicious for metastatic disease. 4. Small enhancing foci in the pelvis anterior to the uterine fundus and posterior with cervix may represent pelvic soft tissue deposits or less likely fibroids are also considered. Aortic Atherosclerosis (ICD10-I70.0). Electronically Signed   By: Keith Rake M.D.   On: 06/11/2019 00:53   [place] Ekg Site Rite  Result Date: 06/11/2019 If Site Rite image not attached, placement could not be confirmed due to current cardiac rhythm.   Anti-infectives: Anti-infectives (From admission, onward)   None      Assessment/Plan: s/p * No surgery found  * Continue bowel rest  Colon wall thickening likely a colon cancer with peritoneal carcinomatosis causing sbo with liver mets. CEA pending Tentatively planning for ex lap tomorrow  LOS: 1 day    Autumn Messing III 06/12/2019

## 2019-06-12 NOTE — Progress Notes (Signed)
Pt family contact information for surgeon. The pt son in law Clista Bernhardt would like a call prior to surgery. He is coming from [place] to be with his mother in [place]. He would be the person to call in case of emergency or questions during surgery as well. His number is [phone]. Thanks [employer]

## 2019-06-12 NOTE — Progress Notes (Signed)
At bedside with pt.  Consent signed for PICC placement.  Pt aware PICC to be placed 06/13/19, possibly postop and may be sedated for procedure.  All questions answered.  Pt comfortable with placement Monday.

## 2019-06-13 ENCOUNTER — Inpatient Hospital Stay (HOSPITAL_COMMUNITY): Payer: Medicare Other | Admitting: Anesthesiology

## 2019-06-13 ENCOUNTER — Encounter (HOSPITAL_COMMUNITY)
Admission: EM | Disposition: A | Discharge status: Home / Self Care | Payer: Self-pay | Source: Home / Self Care | Attending: Internal Medicine

## 2019-06-13 ENCOUNTER — Encounter (HOSPITAL_COMMUNITY): Payer: Self-pay | Admitting: Registered Nurse

## 2019-06-13 HISTORY — PX: PARTIAL COLECTOMY: SHX5273

## 2019-06-13 HISTORY — PX: BOWEL RESECTION: SHX1257

## 2019-06-13 LAB — COMPREHENSIVE METABOLIC PANEL
ALT: 13 U/L (ref 0–44)
AST: 18 U/L (ref 15–41)
Albumin: 2.3 g/dL — ABNORMAL LOW (ref 3.5–5.0)
Alkaline Phosphatase: 94 U/L (ref 38–126)
Anion gap: 7 (ref 5–15)
BUN: <5 mg/dL — ABNORMAL LOW (ref 8–23)
CO2: 28 mmol/L (ref 22–32)
Calcium: 7.9 mg/dL — ABNORMAL LOW (ref 8.9–10.3)
Chloride: 104 mmol/L (ref 98–111)
Creatinine, Ser: 0.43 mg/dL — ABNORMAL LOW (ref 0.44–1.00)
GFR calc Af Amer: >60 mL/min (ref >60)
GFR calc non Af Amer: >60 mL/min (ref >60)
Glucose, Bld: 121 mg/dL — ABNORMAL HIGH (ref 70–99)
Potassium: 3.9 mmol/L (ref 3.5–5.1)
Sodium: 139 mmol/L (ref 135–145)
Total Bilirubin: 0.4 mg/dL (ref 0.3–1.2)
Total Protein: 5.1 g/dL — ABNORMAL LOW (ref 6.5–8.1)

## 2019-06-13 LAB — CBC WITH DIFFERENTIAL/PLATELET
Abs Immature Granulocytes: 0.03 10*3/uL (ref 0.00–0.07)
Basophils Absolute: 0.1 10*3/uL (ref 0.0–0.1)
Basophils Relative: 1 %
Eosinophils Absolute: 0.4 10*3/uL (ref 0.0–0.5)
Eosinophils Relative: 4 %
HCT: 33.6 % — ABNORMAL LOW (ref 36.0–46.0)
Hemoglobin: 10.7 g/dL — ABNORMAL LOW (ref 12.0–15.0)
Immature Granulocytes: 0 %
Lymphocytes Relative: 16 %
Lymphs Abs: 1.5 10*3/uL (ref 0.7–4.0)
MCH: 26.9 pg (ref 26.0–34.0)
MCHC: 31.8 g/dL (ref 30.0–36.0)
MCV: 84.4 fL (ref 80.0–100.0)
Monocytes Absolute: 1 10*3/uL (ref 0.1–1.0)
Monocytes Relative: 11 %
Neutro Abs: 6.2 10*3/uL (ref 1.7–7.7)
Neutrophils Relative %: 68 %
Platelets: 320 10*3/uL (ref 150–400)
RBC: 3.98 MIL/uL (ref 3.87–5.11)
RDW: 13.2 % (ref 11.5–15.5)
WBC: 9.2 10*3/uL (ref 4.0–10.5)
nRBC: 0 % (ref 0.0–0.2)

## 2019-06-13 LAB — GLUCOSE, CAPILLARY: Glucose-Capillary: 109 mg/dL — ABNORMAL HIGH (ref 70–99)

## 2019-06-13 LAB — PHOSPHORUS: Phosphorus: 3.2 mg/dL (ref 2.5–4.6)

## 2019-06-13 LAB — MAGNESIUM: Magnesium: 2.2 mg/dL (ref 1.7–2.4)

## 2019-06-13 SURGERY — COLECTOMY, PARTIAL
Anesthesia: General | Site: Abdomen

## 2019-06-13 MED ORDER — MIDAZOLAM HCL 5 MG/5ML IJ SOLN
INTRAMUSCULAR | Status: DC | PRN
  Administered 2019-06-13: 2 mg via INTRAVENOUS

## 2019-06-13 MED ORDER — ACETAMINOPHEN 10 MG/ML IV SOLN
INTRAVENOUS | Status: DC | PRN
  Administered 2019-06-13: 1000 mg via INTRAVENOUS

## 2019-06-13 MED ORDER — DEXAMETHASONE SODIUM PHOSPHATE 10 MG/ML IJ SOLN
INTRAMUSCULAR | Status: AC
  Filled 2019-06-13: qty 1

## 2019-06-13 MED ORDER — DEXAMETHASONE SODIUM PHOSPHATE 10 MG/ML IJ SOLN
INTRAMUSCULAR | Status: DC | PRN
  Administered 2019-06-13: 5 mg via INTRAVENOUS

## 2019-06-13 MED ORDER — ONDANSETRON HCL 4 MG/2ML IJ SOLN: 4.0000 mg | Freq: Four times a day (QID) | INTRAMUSCULAR | Status: DC | PRN

## 2019-06-13 MED ORDER — ONDANSETRON HCL 4 MG/2ML IJ SOLN
INTRAMUSCULAR | Status: DC | PRN
  Administered 2019-06-13: 4 mg via INTRAVENOUS

## 2019-06-13 MED ORDER — KCL IN DEXTROSE-NACL 20-5-0.45 MEQ/L-%-% IV SOLN
INTRAVENOUS | Status: AC
  Administered 2019-06-13 – 2019-06-14 (×2): via INTRAVENOUS
  Filled 2019-06-13 (×3): qty 1000

## 2019-06-13 MED ORDER — PROMETHAZINE HCL 25 MG/ML IJ SOLN
6.2500 mg | INTRAMUSCULAR | Status: DC | PRN
  Administered 2019-06-13: 6.25 mg via INTRAVENOUS

## 2019-06-13 MED ORDER — PROPOFOL 10 MG/ML IV BOLUS
INTRAVENOUS | Status: AC
  Filled 2019-06-13: qty 20

## 2019-06-13 MED ORDER — FENTANYL CITRATE (PF) 100 MCG/2ML IJ SOLN
INTRAMUSCULAR | Status: AC
  Filled 2019-06-13: qty 2

## 2019-06-13 MED ORDER — ENOXAPARIN SODIUM 40 MG/0.4ML ~~LOC~~ SOLN
40.0000 mg | SUBCUTANEOUS | Status: DC
  Administered 2019-06-14 – 2019-06-23 (×10): 40 mg via SUBCUTANEOUS
  Filled 2019-06-13 (×10): qty 0.4

## 2019-06-13 MED ORDER — CELECOXIB 200 MG PO CAPS
400.0000 mg | ORAL_CAPSULE | Freq: Once | ORAL | Status: DC
  Filled 2019-06-13: qty 2

## 2019-06-13 MED ORDER — ONDANSETRON HCL 4 MG/2ML IJ SOLN
INTRAMUSCULAR | Status: AC
  Filled 2019-06-13: qty 2

## 2019-06-13 MED ORDER — ACETAMINOPHEN 10 MG/ML IV SOLN
INTRAVENOUS | Status: AC
  Filled 2019-06-13: qty 100

## 2019-06-13 MED ORDER — ACETAMINOPHEN 500 MG PO TABS
1000.0000 mg | ORAL_TABLET | Freq: Once | ORAL | Status: DC
  Filled 2019-06-13: qty 2

## 2019-06-13 MED ORDER — MIDAZOLAM HCL 2 MG/2ML IJ SOLN
INTRAMUSCULAR | Status: AC
  Filled 2019-06-13: qty 2

## 2019-06-13 MED ORDER — FENTANYL CITRATE (PF) 250 MCG/5ML IJ SOLN
INTRAMUSCULAR | Status: AC
  Filled 2019-06-13: qty 5

## 2019-06-13 MED ORDER — NALOXONE HCL 0.4 MG/ML IJ SOLN: 0.4000 mg | INTRAMUSCULAR | Status: DC | PRN

## 2019-06-13 MED ORDER — DIPHENHYDRAMINE HCL 12.5 MG/5ML PO ELIX: 12.5000 mg | ORAL_SOLUTION | Freq: Four times a day (QID) | ORAL | Status: DC | PRN

## 2019-06-13 MED ORDER — SCOPOLAMINE 1 MG/3DAYS TD PT72
MEDICATED_PATCH | TRANSDERMAL | Status: DC | PRN
  Administered 2019-06-13: 1 via TRANSDERMAL

## 2019-06-13 MED ORDER — LIDOCAINE 20MG/ML (2%) 15 ML SYRINGE OPTIME
INTRAMUSCULAR | Status: DC | PRN
  Administered 2019-06-13: 1.5 mg/kg/h via INTRAVENOUS

## 2019-06-13 MED ORDER — SCOPOLAMINE 1 MG/3DAYS TD PT72
MEDICATED_PATCH | TRANSDERMAL | Status: AC
  Filled 2019-06-13: qty 1

## 2019-06-13 MED ORDER — PROPOFOL 10 MG/ML IV BOLUS
INTRAVENOUS | Status: DC | PRN
  Administered 2019-06-13: 110 mg via INTRAVENOUS

## 2019-06-13 MED ORDER — ALBUMIN HUMAN 5 % IV SOLN
INTRAVENOUS | Status: AC
  Filled 2019-06-13: qty 500

## 2019-06-13 MED ORDER — PROMETHAZINE HCL 25 MG/ML IJ SOLN
INTRAMUSCULAR | Status: AC
  Filled 2019-06-13: qty 1

## 2019-06-13 MED ORDER — ACETAMINOPHEN 650 MG RE SUPP
650.0000 mg | Freq: Four times a day (QID) | RECTAL | Status: DC | PRN
  Filled 2019-06-13: qty 1

## 2019-06-13 MED ORDER — CHLORHEXIDINE GLUCONATE CLOTH 2 % EX PADS
6.0000 | MEDICATED_PAD | Freq: Every day | CUTANEOUS | Status: DC
  Administered 2019-06-14 – 2019-06-22 (×7): 6 via TOPICAL

## 2019-06-13 MED ORDER — ONDANSETRON HCL 4 MG/2ML IJ SOLN
4.0000 mg | Freq: Four times a day (QID) | INTRAMUSCULAR | Status: DC | PRN
  Administered 2019-06-13 – 2019-06-21 (×15): 4 mg via INTRAVENOUS
  Filled 2019-06-13 (×15): qty 2

## 2019-06-13 MED ORDER — SODIUM CHLORIDE 0.9% FLUSH: 9.0000 mL | INTRAVENOUS | Status: DC | PRN

## 2019-06-13 MED ORDER — SUCCINYLCHOLINE CHLORIDE 200 MG/10ML IV SOSY
PREFILLED_SYRINGE | INTRAVENOUS | Status: DC | PRN
  Administered 2019-06-13: 100 mg via INTRAVENOUS

## 2019-06-13 MED ORDER — SUGAMMADEX SODIUM 200 MG/2ML IV SOLN
INTRAVENOUS | Status: DC | PRN
  Administered 2019-06-13: 150 mg via INTRAVENOUS

## 2019-06-13 MED ORDER — HYDROMORPHONE 1 MG/ML IV SOLN
INTRAVENOUS | Status: DC
  Administered 2019-06-13: 30 mg via INTRAVENOUS
  Filled 2019-06-13: qty 30

## 2019-06-13 MED ORDER — SODIUM CHLORIDE 0.9% FLUSH
10.0000 mL | INTRAVENOUS | Status: DC | PRN
  Administered 2019-06-17: 04:00:00 10 mL
  Filled 2019-06-13: qty 40

## 2019-06-13 MED ORDER — LACTATED RINGERS IV SOLN
INTRAVENOUS | Status: DC
  Administered 2019-06-13: 10:00:00 via INTRAVENOUS

## 2019-06-13 MED ORDER — ALBUMIN HUMAN 5 % IV SOLN
INTRAVENOUS | Status: DC | PRN
  Administered 2019-06-13 (×2): via INTRAVENOUS

## 2019-06-13 MED ORDER — 0.9 % SODIUM CHLORIDE (POUR BTL) OPTIME
TOPICAL | Status: DC | PRN
  Administered 2019-06-13: 10:00:00 3000 mL

## 2019-06-13 MED ORDER — SODIUM CHLORIDE 0.9 % IV SOLN
1.0000 g | INTRAVENOUS | Status: AC
  Administered 2019-06-13: 1 g via INTRAVENOUS
  Filled 2019-06-13: qty 1

## 2019-06-13 MED ORDER — MORPHINE SULFATE (PF) 2 MG/ML IV SOLN
1.0000 mg | INTRAVENOUS | Status: DC | PRN
  Administered 2019-06-14: 05:00:00 2 mg via INTRAVENOUS
  Filled 2019-06-13: qty 1

## 2019-06-13 MED ORDER — HYDRALAZINE HCL 20 MG/ML IJ SOLN
INTRAMUSCULAR | Status: AC
  Filled 2019-06-13: qty 1

## 2019-06-13 MED ORDER — FENTANYL CITRATE (PF) 250 MCG/5ML IJ SOLN
INTRAMUSCULAR | Status: DC | PRN
  Administered 2019-06-13 (×3): 50 ug via INTRAVENOUS
  Administered 2019-06-13: 100 ug via INTRAVENOUS

## 2019-06-13 MED ORDER — LIDOCAINE 2% (20 MG/ML) 5 ML SYRINGE
INTRAMUSCULAR | Status: DC | PRN
  Administered 2019-06-13: 60 mg via INTRAVENOUS

## 2019-06-13 MED ORDER — SODIUM CHLORIDE 0.9% FLUSH
10.0000 mL | Freq: Two times a day (BID) | INTRAVENOUS | Status: DC
  Administered 2019-06-13 – 2019-06-17 (×3): 10 mL

## 2019-06-13 MED ORDER — FENTANYL CITRATE (PF) 100 MCG/2ML IJ SOLN
25.0000 ug | INTRAMUSCULAR | Status: DC | PRN
  Administered 2019-06-13 (×2): 50 ug via INTRAVENOUS

## 2019-06-13 MED ORDER — DIPHENHYDRAMINE HCL 50 MG/ML IJ SOLN: 12.5000 mg | Freq: Four times a day (QID) | INTRAMUSCULAR | Status: DC | PRN

## 2019-06-13 MED ORDER — ROCURONIUM BROMIDE 10 MG/ML (PF) SYRINGE
PREFILLED_SYRINGE | INTRAVENOUS | Status: DC | PRN
  Administered 2019-06-13: 40 mg via INTRAVENOUS
  Administered 2019-06-13: 20 mg via INTRAVENOUS

## 2019-06-13 MED ORDER — ACETAMINOPHEN 325 MG PO TABS: 650.0000 mg | ORAL_TABLET | Freq: Four times a day (QID) | ORAL | Status: DC | PRN

## 2019-06-13 MED ORDER — ACETAMINOPHEN 10 MG/ML IV SOLN
1000.0000 mg | Freq: Four times a day (QID) | INTRAVENOUS | Status: AC
  Administered 2019-06-13 – 2019-06-14 (×4): 1000 mg via INTRAVENOUS
  Filled 2019-06-13 (×6): qty 100

## 2019-06-13 MED ORDER — ONDANSETRON 4 MG PO TBDP
4.0000 mg | ORAL_TABLET | Freq: Four times a day (QID) | ORAL | Status: DC | PRN
  Filled 2019-06-13: qty 1

## 2019-06-13 SURGICAL SUPPLY — 47 items
BLADE EXTENDED COATED 6.5IN (ELECTRODE) IMPLANT
CHLORAPREP W/TINT 26 (MISCELLANEOUS) ×4 IMPLANT
COVER SURGICAL LIGHT HANDLE (MISCELLANEOUS) ×6 IMPLANT
COVER WAND RF STERILE (DRAPES) IMPLANT
DERMABOND ADVANCED (GAUZE/BANDAGES/DRESSINGS)
DERMABOND ADVANCED .7 DNX12 (GAUZE/BANDAGES/DRESSINGS) IMPLANT
DRSG OPSITE POSTOP 4X10 (GAUZE/BANDAGES/DRESSINGS) ×2 IMPLANT
DRSG OPSITE POSTOP 4X6 (GAUZE/BANDAGES/DRESSINGS) IMPLANT
DRSG OPSITE POSTOP 4X8 (GAUZE/BANDAGES/DRESSINGS) IMPLANT
DRSG PAD ABDOMINAL 8X10 ST (GAUZE/BANDAGES/DRESSINGS) IMPLANT
ELECT BLADE 6.5 .24CM SHAFT (ELECTRODE) ×2 IMPLANT
ELECT PENCIL ROCKER SW 15FT (MISCELLANEOUS) ×3 IMPLANT
ELECT REM PT RETURN 15FT ADLT (MISCELLANEOUS) ×3 IMPLANT
GAUZE SPONGE 4X4 12PLY STRL (GAUZE/BANDAGES/DRESSINGS) IMPLANT
GLOVE SURG ORTHO 8.0 STRL STRW (GLOVE) ×6 IMPLANT
GOWN STRL REUS W/TWL XL LVL3 (GOWN DISPOSABLE) ×12 IMPLANT
HANDLE SUCTION POOLE (INSTRUMENTS) IMPLANT
KIT TURNOVER KIT A (KITS) IMPLANT
LIGASURE IMPACT 36 18CM CVD LR (INSTRUMENTS) ×2 IMPLANT
PACK COLON (CUSTOM PROCEDURE TRAY) ×3 IMPLANT
RELOAD PROXIMATE 75MM BLUE (ENDOMECHANICALS) ×12 IMPLANT
RELOAD STAPLE 75 3.8 BLU REG (ENDOMECHANICALS) IMPLANT
SPONGE LAP 18X18 RF (DISPOSABLE) ×2 IMPLANT
STAPLER GUN LINEAR PROX 60 (STAPLE) ×2 IMPLANT
STAPLER PROXIMATE 75MM BLUE (STAPLE) ×2 IMPLANT
STAPLER VISISTAT 35W (STAPLE) ×5 IMPLANT
SUCTION POOLE HANDLE (INSTRUMENTS)
SUT NOV 1 T60/GS (SUTURE) IMPLANT
SUT NOVA NAB DX-16 0-1 5-0 T12 (SUTURE) ×10 IMPLANT
SUT NOVA NAB GS-21 0 18 T12 DT (SUTURE) ×2 IMPLANT
SUT NOVA T20/GS 25 (SUTURE) IMPLANT
SUT PDS AB 1 TP1 96 (SUTURE) IMPLANT
SUT SILK 2 0 (SUTURE) ×4
SUT SILK 2 0 SH CR/8 (SUTURE) ×5 IMPLANT
SUT SILK 2 0SH CR/8 30 (SUTURE) IMPLANT
SUT SILK 2-0 18XBRD TIE 12 (SUTURE) ×1 IMPLANT
SUT SILK 2-0 30XBRD TIE 12 (SUTURE) ×1 IMPLANT
SUT SILK 3 0 (SUTURE) ×2
SUT SILK 3 0 SH CR/8 (SUTURE) ×3 IMPLANT
SUT SILK 3-0 18XBRD TIE 12 (SUTURE) ×1 IMPLANT
SUT VIC AB 3-0 SH 18 (SUTURE) ×2 IMPLANT
TOWEL OR 17X26 10 PK STRL BLUE (TOWEL DISPOSABLE) IMPLANT
TOWEL OR NON WOVEN STRL DISP B (DISPOSABLE) ×6 IMPLANT
TRAY FOLEY MTR SLVR 16FR STAT (SET/KITS/TRAYS/PACK) ×3 IMPLANT
TUBING CONNECTING 10 (TUBING) ×4 IMPLANT
TUBING CONNECTING 10' (TUBING) ×2
WATER STERILE IRR 1000ML POUR (IV SOLUTION) ×2 IMPLANT

## 2019-06-13 NOTE — Anesthesia Procedure Notes (Signed)
Procedure Name: Intubation Date/Time: 06/13/2019 10:19 AM Performed by: Talbot Grumbling, CRNA Pre-anesthesia Checklist: Patient identified, Emergency Drugs available, Suction available and Patient being monitored Patient Re-evaluated:Patient Re-evaluated prior to induction Oxygen Delivery Method: Circle system utilized Preoxygenation: Pre-oxygenation with 100% oxygen Induction Type: IV induction, Rapid sequence and Cricoid Pressure applied Laryngoscope Size: Mac and 3 Grade View: Grade I Tube type: Oral Tube size: 7.0 mm Number of attempts: 1 Airway Equipment and Method: Stylet Placement Confirmation: ETT inserted through vocal cords under direct vision,  positive ETCO2 and breath sounds checked- equal and bilateral Secured at: 21 cm Tube secured with: Tape Dental Injury: Teeth and Oropharynx as per pre-operative assessment

## 2019-06-13 NOTE — Op Note (Signed)
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  CT scan indicates a mass in the transverse colon, omental metastasis, and liver metastasis.  She now comes to surgery for treatment of small bowel obstruction and resection of primary tumor.  Procedure:  The patient was seen in the pre-op holding area. The risks, benefits, complications, treatment options, and expected outcomes were previously discussed with the patient. The patient agreed with the proposed plan and has signed the informed consent form.  The patient was brought to the operating room by the surgical team, identified as Othelia Pulling and the procedure verified. A "time out" was completed and the above information confirmed.  Following administration of general anesthesia, the patient was positioned and then prepped and draped in the usual aseptic fashion.  After ascertaining that an adequate level of anesthesia been achieved, a midline abdominal incision is made with a #10 blade.  Fascia is incised in the midline and the peritoneal cavity is entered cautiously.  There is approximately 750 cc of clear ascitic fluid.  Palpation reveals peritoneal carcinomatosis on virtually all of the peritoneal surfaces.  There is significantly dilated proximal small bowel.  There is a large tumor in the proximal transverse colon with  involvement of the adjacent omentum.  Small bowel is mobilized.  The point of obstruction is identified.  This is due to tumor.  The small bowel is markedly dilated proximal to the tumor and decompressed distally.  The area of obstruction is resected using a GIA stapler to transect the proximal small bowel and a GIA stapler to transect the distal small bowel.  Mesentery is divided with the LigaSure.  Specimen is resected and submitted to pathology for review.  A side-to-side functionally end-to-end anastomosis is created between the proximal and distal ends of the small intestine using a GIA stapler.  Enterotomy is closed with a TA 60 stapler.  Mesenteric defect is closed with interrupted 2-0 silk sutures.  Anastomosis appears to be widely patent.  Next the right colon is mobilized along its lateral peritoneal attachments.  Hepatic flexure of the colon is taken down.  Dissection is carried distal to the tumor.  Transverse colon is divided at its midpoint with a GIA 75 stapler.  The gastrocolic ligament is divided with the LigaSure.  A point in the distal ascending colon is transected with a GIA 75 stapler.  Mesentery is divided with the LigaSure.  The proximal transverse colon is thus resected and submitted to pathology for review.  It contains the primary tumor and the adherent omentum containing tumor.  The ascending colon is then prepared for colostomy.  An ellipse of skin is excised from the mid right abdominal wall.  Subcutaneous tissue was excised.  Fascia is incised in a cruciate fashion and the rectus muscle divided in line of its fibers.  Peritoneal cavity is entered cautiously.  Using a Babcock clamp the a sending colon is delivered through the abdominal wall.  It is secured  to the fascia circumferentially with interrupted 3-0 silk sutures.  Abdomen is irrigated copiously with warm saline.  This is evacuated.  Viscera are returned to the peritoneal cavity.  Midline incision is closed with  interrupted #1 Novafil simple sutures.  Subcutaneous tissues are irrigated.  Skin is closed with stainless steel staples.  Staple line is excised from the a sending colon.  Colostomy is then matured in the usual fashion with interrupted 3-0 Vicryl sutures.  Honeycomb dressing is placed on the midline incision.  Colostomy bag is placed over the colostomy.  Patient is awakened from anesthesia and brought to the recovery room.  The patient tolerated the procedure well.   [name], MD [facility] Office: [phone]

## 2019-06-13 NOTE — Transfer of Care (Signed)
Immediate Anesthesia Transfer of Care Note  Patient: Ellen Larson  Procedure(s) Performed: exploratory laparotomy partial colectomy with ascendingcolostomy (N/A Abdomen) SMALL BOWEL RESECTION (N/A Abdomen)  Patient Location: PACU  Anesthesia Type:General  Level of Consciousness: sedated  Airway & Oxygen Therapy: Patient Spontanous Breathing and Patient connected to face mask oxygen  Post-op Assessment: Report given to RN and Post -op Vital signs reviewed and stable  Post vital signs: Reviewed and stable  Last Vitals:  Vitals Value Taken Time  BP 146/81 06/13/19 1220  Temp    Pulse 102 06/13/19 1222  Resp 14 06/13/19 1222  SpO2 100 % 06/13/19 1222  Vitals shown include unvalidated device data.  Last Pain:  Vitals:   06/13/19 0941  TempSrc: Oral  PainSc:       Patients Stated Pain Goal: 4 ([id number])  Complications: No apparent anesthesia complications

## 2019-06-13 NOTE — H&P (View-Only) (Signed)
Assessment & Plan: High grade SBO, probable metastatic colon carcinoma  Plan ex lap today for resection / bypass / colostomy   Discussed CT findings and clinical impression with patient at length.  Discussed options for surgery.  Plan to proceed this morning with ex lap.  Patient understands and agrees.  The risks and benefits of the procedure have been discussed at length with the patient.  The patient understands the proposed procedure, potential alternative treatments, and the course of recovery to be expected.  All of the patient's questions have been answered at this time.  The patient wishes to proceed with surgery.        [name], MD       [facility]       Office: [phone]   Chief Complaint: Partial SBO, probable colon cancer with metastasis  Subjective: Patient in good spirits, no complaints.  Distended.  No BM's.  Objective: Vital signs in last 24 hours: Temp:  [98.7 F (37.1 C)-99.3 F (37.4 C)] 98.7 F (37.1 C) (09/14 0523) Pulse Rate:  [81-90] 81 (09/14 0523) Resp:  [15-19] 19 (09/14 0523) BP: (121-133)/(65-76) 121/65 (09/14 0523) SpO2:  [97 %-100 %] 97 % (09/14 0523) Last BM Date: 06/05/19  Intake/Output from previous day: 09/13 0701 - 09/14 0700 In: 1831.5 [I.V.:1274.5; IV Piggyback:557] Out: -  Intake/Output this shift: No intake/output data recorded.  Physical Exam: HEENT - sclerae clear, mucous membranes moist Neck - soft Chest - clear bilaterally Cor - RRR Abdomen - soft, moderate distension; BS present; no mass; non-tender Ext - no edema, non-tender Neuro - alert & oriented, no focal deficits  Lab Results:  Recent Labs    06/12/19 0324 06/13/19 0350  WBC 10.7* 9.2  HGB 12.3 10.7*  HCT 38.9 33.6*  PLT 414* 320   BMET Recent Labs    06/12/19 0324 06/13/19 0350  NA 138 139  K 3.5 3.9  CL 100 104  CO2 25 28  GLUCOSE 137* 121*  BUN <5* <5*  CREATININE 0.47 0.43*  CALCIUM 8.5* 7.9*   PT/INR No  results for input(s): LABPROT, INR in the last 72 hours. Comprehensive Metabolic Panel:    Component Value Date/Time   NA 139 06/13/2019 0350   NA 138 06/12/2019 0324   K 3.9 06/13/2019 0350   K 3.5 06/12/2019 0324   CL 104 06/13/2019 0350   CL 100 06/12/2019 0324   CO2 28 06/13/2019 0350   CO2 25 06/12/2019 0324   BUN <5 (L) 06/13/2019 0350   BUN <5 (L) 06/12/2019 0324   CREATININE 0.43 (L) 06/13/2019 0350   CREATININE 0.47 06/12/2019 0324   GLUCOSE 121 (H) 06/13/2019 0350   GLUCOSE 137 (H) 06/12/2019 0324   CALCIUM 7.9 (L) 06/13/2019 0350   CALCIUM 8.5 (L) 06/12/2019 0324   AST 18 06/13/2019 0350   AST 19 06/12/2019 0324   ALT 13 06/13/2019 0350   ALT 17 06/12/2019 0324   ALKPHOS 94 06/13/2019 0350   ALKPHOS 112 06/12/2019 0324   BILITOT 0.4 06/13/2019 0350   BILITOT 0.4 06/12/2019 0324   PROT 5.1 (L) 06/13/2019 0350   PROT 5.9 (L) 06/12/2019 0324   ALBUMIN 2.3 (L) 06/13/2019 0350   ALBUMIN 2.7 (L) 06/12/2019 0324    Studies/Results: Dg Abd 1 View  Result Date: 06/12/2019 CLINICAL DATA:  63 year old female with history of abdominal pain. EXAM: ABDOMEN - 1 VIEW COMPARISON:  Abdominal radiograph 06/01/2019. FINDINGS: Some gas and stool are noted in the  colon and distal rectum. However, there continue to be multiple dilated loops of small bowel in the central abdomen measuring up to 5.1 cm in diameter, compatible with persistent partial small bowel obstruction. No gross evidence of pneumoperitoneum on this single supine view. IMPRESSION: 1. Findings are compatible with persistent partial small bowel obstruction. Electronically Signed   By: Vinnie Langton M.D.   On: 06/12/2019 04:52   [place] Ekg Site Rite  Result Date: 06/11/2019 If Site Rite image not attached, placement could not be confirmed due to current cardiac rhythm.     [name] 06/13/2019  Patient ID: Ellen Larson, female   DOB: Aug 15, 1956, 63 y.o.   MRN: [id number]

## 2019-06-13 NOTE — Interval H&P Note (Signed)
History and Physical Interval Note:  06/13/2019 10:06 AM  Ellen Larson  has presented today for surgery, with the diagnosis of bowel obstruction.  The various methods of treatment have been discussed with the patient and family. After consideration of risks, benefits and other options for treatment, the patient has consented to  Procedure(s): exploratory laparotomy partial colectomy,colostomy,livery biopsy (N/A) as a surgical intervention.  The patient's history has been reviewed, patient examined, no change in status, stable for surgery.  I have reviewed the patient's chart and labs.  Questions were answered to the patient's satisfaction.     [name]

## 2019-06-13 NOTE — Anesthesia Preprocedure Evaluation (Addendum)
Anesthesia Evaluation  Patient identified by MRN, date of birth, ID band Patient awake    Reviewed: Allergy & Precautions, NPO status , Patient's Chart, lab work & pertinent test results  History of Anesthesia Complications Negative for: history of anesthetic complications  Airway Mallampati: II  TM Distance: >3 FB Neck ROM: Full    Dental no notable dental hx. (+) Dental Advisory Given   Pulmonary former smoker,    Pulmonary exam normal        Cardiovascular negative cardio ROS Normal cardiovascular exam     Neuro/Psych negative neurological ROS  negative psych ROS   GI/Hepatic negative GI ROS, Neg liver ROS,   Endo/Other  negative endocrine ROS  Renal/GU   negative genitourinary   Musculoskeletal negative musculoskeletal ROS (+)   Abdominal   Peds negative pediatric ROS (+)  Hematology negative hematology ROS (+)   Anesthesia Other Findings   Reproductive/Obstetrics negative OB ROS                            Anesthesia Physical Anesthesia Plan  ASA: III  Anesthesia Plan: General   Post-op Pain Management:    Induction: Intravenous  PONV Risk Score and Plan: 4 or greater and Ondansetron, Dexamethasone, Scopolamine patch - Pre-op and Midazolam  Airway Management Planned: Oral ETT  Additional Equipment:   Intra-op Plan:   Post-operative Plan: Extubation in OR  Informed Consent: I have reviewed the patients History and Physical, chart, labs and discussed the procedure including the risks, benefits and alternatives for the proposed anesthesia with the patient or authorized representative who has indicated his/her understanding and acceptance.     Dental advisory given  Plan Discussed with: CRNA, Anesthesiologist and Surgeon  Anesthesia Plan Comments:        Anesthesia Quick Evaluation

## 2019-06-13 NOTE — Progress Notes (Signed)
Assessment & Plan: High grade SBO, probable metastatic colon carcinoma  Plan ex lap today for resection / bypass / colostomy   Discussed CT findings and clinical impression with patient at length.  Discussed options for surgery.  Plan to proceed this morning with ex lap.  Patient understands and agrees.  The risks and benefits of the procedure have been discussed at length with the patient.  The patient understands the proposed procedure, potential alternative treatments, and the course of recovery to be expected.  All of the patient's questions have been answered at this time.  The patient wishes to proceed with surgery.        [name], MD       [facility]       Office: [phone]   Chief Complaint: Partial SBO, probable colon cancer with metastasis  Subjective: Patient in good spirits, no complaints.  Distended.  No BM's.  Objective: Vital signs in last 24 hours: Temp:  [98.7 F (37.1 C)-99.3 F (37.4 C)] 98.7 F (37.1 C) (09/14 0523) Pulse Rate:  [81-90] 81 (09/14 0523) Resp:  [15-19] 19 (09/14 0523) BP: (121-133)/(65-76) 121/65 (09/14 0523) SpO2:  [97 %-100 %] 97 % (09/14 0523) Last BM Date: 06/05/19  Intake/Output from previous day: 09/13 0701 - 09/14 0700 In: 1831.5 [I.V.:1274.5; IV Piggyback:557] Out: -  Intake/Output this shift: No intake/output data recorded.  Physical Exam: HEENT - sclerae clear, mucous membranes moist Neck - soft Chest - clear bilaterally Cor - RRR Abdomen - soft, moderate distension; BS present; no mass; non-tender Ext - no edema, non-tender Neuro - alert & oriented, no focal deficits  Lab Results:  Recent Labs    06/12/19 0324 06/13/19 0350  WBC 10.7* 9.2  HGB 12.3 10.7*  HCT 38.9 33.6*  PLT 414* 320   BMET Recent Labs    06/12/19 0324 06/13/19 0350  NA 138 139  K 3.5 3.9  CL 100 104  CO2 25 28  GLUCOSE 137* 121*  BUN <5* <5*  CREATININE 0.47 0.43*  CALCIUM 8.5* 7.9*   PT/INR No  results for input(s): LABPROT, INR in the last 72 hours. Comprehensive Metabolic Panel:    Component Value Date/Time   NA 139 06/13/2019 0350   NA 138 06/12/2019 0324   K 3.9 06/13/2019 0350   K 3.5 06/12/2019 0324   CL 104 06/13/2019 0350   CL 100 06/12/2019 0324   CO2 28 06/13/2019 0350   CO2 25 06/12/2019 0324   BUN <5 (L) 06/13/2019 0350   BUN <5 (L) 06/12/2019 0324   CREATININE 0.43 (L) 06/13/2019 0350   CREATININE 0.47 06/12/2019 0324   GLUCOSE 121 (H) 06/13/2019 0350   GLUCOSE 137 (H) 06/12/2019 0324   CALCIUM 7.9 (L) 06/13/2019 0350   CALCIUM 8.5 (L) 06/12/2019 0324   AST 18 06/13/2019 0350   AST 19 06/12/2019 0324   ALT 13 06/13/2019 0350   ALT 17 06/12/2019 0324   ALKPHOS 94 06/13/2019 0350   ALKPHOS 112 06/12/2019 0324   BILITOT 0.4 06/13/2019 0350   BILITOT 0.4 06/12/2019 0324   PROT 5.1 (L) 06/13/2019 0350   PROT 5.9 (L) 06/12/2019 0324   ALBUMIN 2.3 (L) 06/13/2019 0350   ALBUMIN 2.7 (L) 06/12/2019 0324    Studies/Results: Dg Abd 1 View  Result Date: 06/12/2019 CLINICAL DATA:  63 year old female with history of abdominal pain. EXAM: ABDOMEN - 1 VIEW COMPARISON:  Abdominal radiograph 06/01/2019. FINDINGS: Some gas and stool are noted in the  colon and distal rectum. However, there continue to be multiple dilated loops of small bowel in the central abdomen measuring up to 5.1 cm in diameter, compatible with persistent partial small bowel obstruction. No gross evidence of pneumoperitoneum on this single supine view. IMPRESSION: 1. Findings are compatible with persistent partial small bowel obstruction. Electronically Signed   By: Vinnie Langton M.D.   On: 06/12/2019 04:52   [place] Ekg Site Rite  Result Date: 06/11/2019 If Site Rite image not attached, placement could not be confirmed due to current cardiac rhythm.     [name] 06/13/2019  Patient ID: Ellen Larson, female   DOB: 03-24-1956, 63 y.o.   MRN: [id number]

## 2019-06-13 NOTE — Anesthesia Postprocedure Evaluation (Signed)
Anesthesia Post Note  Patient: Ellen Larson  Procedure(s) Performed: exploratory laparotomy partial colectomy with ascendingcolostomy (N/A Abdomen) SMALL BOWEL RESECTION (N/A Abdomen)     Patient location during evaluation: PACU Anesthesia Type: General Level of consciousness: sedated Pain management: pain level controlled Vital Signs Assessment: post-procedure vital signs reviewed and stable Respiratory status: spontaneous breathing and respiratory function stable Cardiovascular status: stable Postop Assessment: no apparent nausea or vomiting Anesthetic complications: no    Last Vitals:  Vitals:   06/13/19 1315 06/13/19 1330  BP: (!) 141/83 (!) 147/79  Pulse: 96 97  Resp: (!) 8 11  Temp:  37.1 C  SpO2: 100% 100%    Last Pain:  Vitals:   06/13/19 1330  TempSrc:   PainSc: Asleep                 [name]

## 2019-06-13 NOTE — Progress Notes (Signed)
Upon assessment of patient, RN observed pt was very drowsy.  Pt RR was noted at 7. Pt was awoken and became alert, however she began to fall asleep while we were talking, but she woke herself up. Her CO2 and O2 remained within normal limits. PCA was turned off by RN. Paged MD RE: pt RR and LOC on PCA. MD agreed to turn off PCA and to add scheduled IV tylenol along with change to morphine PRN order. Orders entered. Upon reassessment, pt RR has improved and LOC is better but pt remains very drowsy.  Will continue to monitor.

## 2019-06-13 NOTE — Progress Notes (Signed)
PROGRESS NOTE    Ellen Larson  [id number] DOB: 01-07-1956 DOA: 06/10/2019 PCP: Patient, No Pcp Per   Brief Narrative:  Patient is a 63 year old Caucasian female with a past medical history of renal disease and no other past medical history presented to the ED with nausea vomiting and abdominal pain as well as unexplained significant weight loss.  She is unable to keep her food down next extensively and has been having intermittent nausea vomiting over the last 6 weeks which is gotten worse.  She has not seen a doctor in over 20 years as well.  She was seen in outpatient setting for an umbilical hernia referred to general surgery and sent to the ER based on her history.  In the ER she was evaluated and found to have a small bowel obstruction and a colonic malignancy likely with metastasis.  She has been having loose stools mainly with small formed stools and last bowel movement was yesterday.  She was evaluated and admitted for small bowel obstruction and general surgery was consulted.  Small bowel obstruction is likely secondary to her colonic malignancy and an NG tube was to be inserted but I do not think one was as when I walked in the room she did not have 1.  General surgery evaluated and she is currently n.p.o. and having IV fluids and supportive care.  Dr. 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  He feels that she will likely require diverting colostomy if it is even possible to work an abdominal cavity and she will be started on TPN once PICC is placed.  Patient is still pending to be placed and patient underwent surgical interventions morning.  Assessment & Plan:   Principal Problem:   SBO (small bowel obstruction) (HCC) Active Problems:   Hypokalemia   Weight loss  Suspected Colon cancer metastasized to liver (HCC)  Small Bowel Obstruction, persistent  -Suspected secondary to colon malignancy.  -Has never had colonoscopy. Surgery to evaluate patient.  -C/w IV fluids and supportive care. -Continue with antiemetics with ondansetron 4 mg p.o./IV every 6 as needed for nausea and pain control with IV morphine 2 g every 4 PRN for severe pain -We will place PICC line and start TPN based on general surgery recommendations and PICC is still to be placed -Repeat KUB yesterday AM showed "Some gas and stool are noted in the colon and distal rectum. However, there continue to be multiple dilated loops of small bowel in the central abdomen measuring up to 5.1 cm in diameter, compatible with persistent partial small bowel obstruction. No gross evidence of pneumoperitoneum on this single supine view."  This is compatible with a persistent SBO -Appreciate further surgical care and she went for Surgical Intervention and had an exploratory laparotomy, small bowel resection with primary anastomosis and partial colectomy with end colostomy -Patient still has PICC line to be placed and still pending to be done so she can start on TPN -Currently has NG tube in place and will have bowel rest -We will need to follow-up on surgical pathology results -She has a PCA pump per general surgery for pain control but thinks it is too much as it "knocked her out of her head".  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  Hypophosphatemia -Patient's Phos Level this AM was 3.2 -Continue to Monitor and Replete as Necessary -Repeat Phos Level in AM   Unintentional weight loss -Most likely secondary to suspected cancer as well as anorexia.  -Nutrition counseling with addressing underlying cause. -We will place a PICC line for TPN and will call dietitian for further evaluation recommendations -In the interim continue with D5 Normal Saline +40 mEq of KCl at 75 mL's per hour and this was changed to D5 half-normal saline +20 mEq of KCl at 100 mL's per hour  Hyponatremia/Hypochloremia, improved  -In setting of poor p.o. intake -Continued with supportive care and IV fluid hydration with D5 normal saline +40 mEq of KCl at 75 mL/hr this was changed to D5 half-normal saline with 20 mEq of KCl at 100 mL's per hour by general surgery -Patient's sodium is now improving to 139 and chloride has gone from 87 is now 104 -Repeat CMP in the a.m.  Anion Gap Metabolic Acidosis -Continue with supportive care and IV fluids as above; Now improved  -Patient CO2 is now 28 and anion gap i7 -C/w IVF hydration as above -Continue to monitor and trend and repeat CMP in a.m.  Leukocytosis -Improving. WBC went from 12.3 -> 11.5 -> 10.7 -Likely from SBO -Continue to Monitor for S/Sx of Infection -Repeat CBC in AM   Thrombocytosis -Patient's Platelet Count went to 414 and is now 320 -Likely Reactive -Continue to Monitor and Trend -Repeat CBC in AM   DVT prophylaxis: Heparin 5,000 units sq q8h Code Status: FULL CODE  Family Communication: Discussed with nephew at bedside Disposition Plan: Pending further Surgical Evaluation and Recc's  Consultants:   General Surgery   Procedures: None  Antimicrobials:  Anti-infectives (From admission, onward)   Start     Dose/Rate Route Frequency Ordered Stop   06/13/19 0933  ertapenem (INVANZ) 1,000 mg in sodium chloride 0.9 % 100 mL IVPB     1 g 200 mL/hr over 30 Minutes Intravenous 30 min pre-op 06/13/19 0930 06/13/19 1042     Subjective: Seen and examined at bedside after her surgery and she is still complaining of a lot of abdominal pain.  Was feeling a little sleepy.  No chest pain, lightheadedness and has NG tube in place.  No other concerns or complaints at this time but states that she does not want to start stronger pain medication to "knock her out".  Objective: Vitals:   06/13/19 1420 06/13/19 1428 06/13/19 1451 06/13/19 1637  BP: (!) 149/65  (!) 156/69 (!) 152/80  Pulse: (!) 102  (!) 105 95  Resp: 16 17 18 16   Temp: 98.1 F (36.7 C)  97.7 F (36.5 C)   TempSrc:  Oral   SpO2: 100% 96% 99% 100%  Weight:      Height:        Intake/Output Summary (Last 24 hours) at 06/13/2019 1643 Last data filed at 06/13/2019 1223 Gross per 24 hour  Intake 2126.92 ml  Output 1050 ml  Net 1076.92 ml   Filed Weights   06/10/19 1208  Weight: 49 kg   Examination: Physical Exam:  Constitutional: Thin Caucasian female currently lying in bed in mild distress appears calm but uncomfortable Eyes: Lids and conjunctivae normal, sclerae anicteric  ENMT: External Ears, Nose appear normal. Grossly normal hearing.  NG tube in place connected to suction Neck: Appears normal, supple, no cervical masses, normal ROM, no appreciable thyromegaly; no JVD Respiratory: Diminished to auscultation bilaterally, no wheezing, rales, rhonchi or crackles. Normal respiratory effort and patient is not tachypenic. No accessory muscle use.  Wearing nasal cannula for PCA pump  Cardiovascular: Slightly tachycardic rate but regular rhythm, no murmurs / rubs / gallops. S1 and S2 auscultated. No extremity edema. Abdomen: Soft, tender to palpate, slightly distended and now has a new colostomy.  Bowel sounds diminished.  She has a midline abdominal incision with a honeycomb dressing GU: Deferred. Musculoskeletal: No clubbing / cyanosis of digits/nails. No joint deformity upper and lower extremities. Skin: No rashes, lesions, ulcers on limited skin evaluation. No induration; Warm and dry.  Neurologic: CN 2-12 grossly intact with no focal deficits. Romberg sign and cerebellar reflexes not assessed.  Psychiatric: Normal judgment and insight.  A little drowsy. slightly anxious mood and appropriate affect.   Data Reviewed: I have personally reviewed following labs and imaging studies  CBC: Recent Labs  Lab 06/10/19 1248 06/11/19 0613 06/12/19 0324 06/13/19 0350  WBC 12.3* 11.5* 10.7* 9.2  NEUTROABS  --   --  7.5 6.2  HGB 13.1 12.0 12.3 10.7*  HCT 40.2 36.6 38.9 33.6*  MCV 82.9 83.8 84.6 84.4  PLT 440* 361 414* [id number]   Basic Metabolic Panel: Recent Labs  Lab 06/10/19 1248 06/11/19 0613 06/12/19 0324 06/13/19 0350  NA 133* 134* 138 139  K 2.8* 3.1* 3.5 3.9  CL 87* 96* 100 104  CO2 25 20* 25 28  GLUCOSE 107* 146* 137* 121*  BUN 12 7* <5* <5*  CREATININE 0.61 0.44 0.47 0.43*  CALCIUM 8.9 8.2* 8.5* 7.9*  MG  --  1.8 1.7 2.2  PHOS  --  2.7 2.0* 3.2   GFR: Estimated Creatinine Clearance: 54.2 mL/min (A) (by C-G formula based on SCr of 0.43 mg/dL (L)). Liver Function Tests: Recent Labs  Lab 06/10/19 1248 06/11/19 0613 06/12/19 0324 06/13/19 0350  AST 26 19 19 18   ALT 25 19 17 13   ALKPHOS 145* 123 112 94  BILITOT 1.0 1.0 0.4 0.4  PROT 7.1 6.0* 5.9* 5.1*  ALBUMIN 3.3* 2.7* 2.7* 2.3*   Recent Labs  Lab 06/10/19 1248  LIPASE 22   No results for input(s): AMMONIA in the last 168 hours. Coagulation Profile: No results for input(s): INR, PROTIME in  the last 168 hours. Cardiac Enzymes: No results for input(s): CKTOTAL, CKMB, CKMBINDEX, TROPONINI in the last 168 hours. BNP (last 3 results) No results for input(s): PROBNP in the last 8760 hours. HbA1C: No results for input(s): HGBA1C in the last 72 hours. CBG: Recent Labs  Lab 06/13/19 0400  GLUCAP 109*   Lipid Profile: No results for input(s): CHOL, HDL, LDLCALC, TRIG, CHOLHDL, LDLDIRECT in the last 72 hours. Thyroid Function Tests: No results for input(s): TSH, T4TOTAL,  FREET4, T3FREE, THYROIDAB in the last 72 hours. Anemia Panel: No results for input(s): VITAMINB12, FOLATE, FERRITIN, TIBC, IRON, RETICCTPCT in the last 72 hours. Sepsis Labs: No results for input(s): PROCALCITON, LATICACIDVEN in the last 168 hours.  Recent Results (from the past 240 hour(s))  SARS Coronavirus 2 [facility] [address] order, Performed in [facility] hospital lab) Nasopharyngeal Nasopharyngeal Swab     Status: None   Collection Time: 06/11/19  1:02 AM   Specimen: Nasopharyngeal Swab  Result Value Ref Range Status   SARS Coronavirus 2 NEGATIVE NEGATIVE Final    Comment: (NOTE) If result is NEGATIVE SARS-CoV-2 target nucleic acids are NOT DETECTED. The SARS-CoV-2 RNA is generally detectable in upper and lower  respiratory specimens during the acute phase of infection. The lowest  concentration of SARS-CoV-2 viral copies this assay can detect is 250  copies / mL. A negative result does not preclude SARS-CoV-2 infection  and should not be used as the sole basis for treatment or other  patient management decisions.  A negative result may occur with  improper specimen collection / handling, submission of specimen other  than nasopharyngeal swab, presence of viral mutation(s) within the  areas targeted by this assay, and inadequate number of viral copies  (<250 copies / mL). A negative result must be combined with clinical  observations, patient history, and epidemiological information. If result is POSITIVE  SARS-CoV-2 target nucleic acids are DETECTED. The SARS-CoV-2 RNA is generally detectable in upper and lower  respiratory specimens dur ing the acute phase of infection.  Positive  results are indicative of active infection with SARS-CoV-2.  Clinical  correlation with patient history and other diagnostic information is  necessary to determine patient infection status.  Positive results do  not rule out bacterial infection or co-infection with other viruses. If result is PRESUMPTIVE POSTIVE SARS-CoV-2 nucleic acids MAY BE PRESENT.   A presumptive positive result was obtained on the submitted specimen  and confirmed on repeat testing.  While 2019 novel coronavirus  (SARS-CoV-2) nucleic acids may be present in the submitted sample  additional confirmatory testing may be necessary for epidemiological  and / or clinical management purposes  to differentiate between  SARS-CoV-2 and other Sarbecovirus currently known to infect humans.  If clinically indicated additional testing with an alternate test  methodology [phone]) is advised. The SARS-CoV-2 RNA is generally  detectable in upper and lower respiratory sp ecimens during the acute  phase of infection. The expected result is Negative. Fact Sheet for Patients:  StrictlyIdeas.no Fact Sheet for Healthcare Providers: BankingDealers.co.za This test is not yet approved or cleared by the [place] FDA and has been authorized for detection and/or diagnosis of SARS-CoV-2 by FDA under an Emergency Use Authorization (EUA).  This EUA will remain in effect (meaning this test can be used) for the duration of the COVID-19 declaration under Section 564(b)(1) of the Act, 21 U.S.C. section 360bbb-3(b)(1), unless the authorization is terminated or revoked sooner. Performed at [facility], [place] [address] [place], [place] [address]     Radiology Studies: Dg Abd 1 View  Result Date:  06/12/2019 CLINICAL DATA:  63 year old female with history of abdominal pain. EXAM: ABDOMEN - 1 VIEW COMPARISON:  Abdominal radiograph 06/01/2019. FINDINGS: Some gas and stool are noted in the colon and distal rectum. However, there continue to be multiple dilated loops of small bowel in the central abdomen measuring up to 5.1 cm in diameter, compatible with persistent partial small bowel obstruction. No gross evidence of pneumoperitoneum  on this single supine view. IMPRESSION: 1. Findings are compatible with persistent partial small bowel obstruction. Electronically Signed   By: Vinnie Langton M.D.   On: 06/12/2019 04:52   Scheduled Meds: . [START ON 06/14/2019] enoxaparin (LOVENOX) injection  40 mg Subcutaneous Q24H  . fentaNYL      . fentaNYL      . HYDROmorphone   Intravenous Q4H  . promethazine      . scopolamine      . sodium chloride flush  3 mL Intravenous Once   Continuous Infusions: . dextrose 5 % and 0.45 % NaCl with KCl 20 mEq/L 100 mL/hr at 06/13/19 1447    LOS: 2 days   Kerney Elbe, DO [facility] PAGER is on [place]  If 7PM-7AM, please contact night-coverage [email or web address] Password [facility] 06/13/2019, 4:43 PM

## 2019-06-13 NOTE — Progress Notes (Signed)
Peripherally Inserted Central Catheter/Midline Placement  The IV Nurse has discussed with the patient and/or persons authorized to consent for the patient, the purpose of this procedure and the potential benefits and risks involved with this procedure.  The benefits include less needle sticks, lab draws from the catheter, and the patient may be discharged home with the catheter. Risks include, but not limited to, infection, bleeding, blood clot (thrombus formation), and puncture of an artery; nerve damage and irregular heartbeat and possibility to perform a PICC exchange if needed/ordered by physician.  Alternatives to this procedure were also discussed.  Bard Power PICC patient education guide, fact sheet on infection prevention and patient information card has been provided to patient /or left at bedside.    PICC/Midline Placement Documentation  PICC Double Lumen [id number] PICC Right Basilic 35 cm 0 cm (Active)  Indication for Insertion or Continuance of Line Administration of hyperosmolar/irritating solutions (i.e. TPN, Vancomycin, etc.) 06/13/19 1846  Exposed Catheter (cm) 0 cm 06/13/19 1846  Site Assessment Clean;Dry;Intact 06/13/19 1846  Lumen #1 Status Flushed;Blood return noted;Saline locked 06/13/19 1846  Lumen #2 Status Flushed;Blood return noted;Saline locked 06/13/19 1846  Dressing Type Transparent 06/13/19 1846  Dressing Status Clean;Dry;Intact;Antimicrobial disc in place 06/13/19 1846  Dressing Change Due 06/20/19 06/13/19 1846       Scotty Court 06/13/2019, 6:48 PM

## 2019-06-14 ENCOUNTER — Encounter (HOSPITAL_COMMUNITY): Payer: Self-pay | Admitting: Surgery

## 2019-06-14 DIAGNOSIS — D649 Anemia, unspecified: Secondary | ICD-10-CM

## 2019-06-14 LAB — COMPREHENSIVE METABOLIC PANEL
ALT: 11 U/L (ref 0–44)
AST: 14 U/L — ABNORMAL LOW (ref 15–41)
Albumin: 2.6 g/dL — ABNORMAL LOW (ref 3.5–5.0)
Alkaline Phosphatase: 75 U/L (ref 38–126)
Anion gap: 6 (ref 5–15)
BUN: <5 mg/dL — ABNORMAL LOW (ref 8–23)
CO2: 28 mmol/L (ref 22–32)
Calcium: 7.9 mg/dL — ABNORMAL LOW (ref 8.9–10.3)
Chloride: 101 mmol/L (ref 98–111)
Creatinine, Ser: 0.33 mg/dL — ABNORMAL LOW (ref 0.44–1.00)
GFR calc Af Amer: >60 mL/min (ref >60)
GFR calc non Af Amer: >60 mL/min (ref >60)
Glucose, Bld: 181 mg/dL — ABNORMAL HIGH (ref 70–99)
Potassium: 4.2 mmol/L (ref 3.5–5.1)
Sodium: 135 mmol/L (ref 135–145)
Total Bilirubin: 0.4 mg/dL (ref 0.3–1.2)
Total Protein: 5.5 g/dL — ABNORMAL LOW (ref 6.5–8.1)

## 2019-06-14 LAB — CBC WITH DIFFERENTIAL/PLATELET
Abs Immature Granulocytes: 0.03 10*3/uL (ref 0.00–0.07)
Basophils Absolute: 0 10*3/uL (ref 0.0–0.1)
Basophils Relative: 0 %
Eosinophils Absolute: 0 10*3/uL (ref 0.0–0.5)
Eosinophils Relative: 0 %
HCT: 36.3 % (ref 36.0–46.0)
Hemoglobin: 11.5 g/dL — ABNORMAL LOW (ref 12.0–15.0)
Immature Granulocytes: 0 %
Lymphocytes Relative: 10 %
Lymphs Abs: 1.1 10*3/uL (ref 0.7–4.0)
MCH: 26.9 pg (ref 26.0–34.0)
MCHC: 31.7 g/dL (ref 30.0–36.0)
MCV: 85 fL (ref 80.0–100.0)
Monocytes Absolute: 1 10*3/uL (ref 0.1–1.0)
Monocytes Relative: 9 %
Neutro Abs: 9.1 10*3/uL — ABNORMAL HIGH (ref 1.7–7.7)
Neutrophils Relative %: 81 %
Platelets: 313 10*3/uL (ref 150–400)
RBC: 4.27 MIL/uL (ref 3.87–5.11)
RDW: 13.2 % (ref 11.5–15.5)
WBC: 11.2 10*3/uL — ABNORMAL HIGH (ref 4.0–10.5)
nRBC: 0 % (ref 0.0–0.2)

## 2019-06-14 LAB — PHOSPHORUS: Phosphorus: 2.6 mg/dL (ref 2.5–4.6)

## 2019-06-14 LAB — MAGNESIUM: Magnesium: 1.8 mg/dL (ref 1.7–2.4)

## 2019-06-14 LAB — GLUCOSE, CAPILLARY: Glucose-Capillary: 121 mg/dL — ABNORMAL HIGH (ref 70–99)

## 2019-06-14 MED ORDER — PROCHLORPERAZINE EDISYLATE 10 MG/2ML IJ SOLN
10.0000 mg | Freq: Four times a day (QID) | INTRAMUSCULAR | Status: DC | PRN
  Administered 2019-06-14: 08:00:00 10 mg via INTRAVENOUS
  Filled 2019-06-14: qty 2

## 2019-06-14 MED ORDER — MORPHINE SULFATE (PF) 2 MG/ML IV SOLN
1.0000 mg | INTRAVENOUS | Status: DC | PRN
  Administered 2019-06-14 – 2019-06-17 (×14): 2 mg via INTRAVENOUS
  Administered 2019-06-17 (×2): 1 mg via INTRAVENOUS
  Administered 2019-06-18: 19:00:00 2 mg via INTRAVENOUS
  Administered 2019-06-18: 1 mg via INTRAVENOUS
  Administered 2019-06-18 – 2019-06-19 (×3): 2 mg via INTRAVENOUS
  Administered 2019-06-19: 1 mg via INTRAVENOUS
  Administered 2019-06-19 (×2): 2 mg via INTRAVENOUS
  Filled 2019-06-14 (×26): qty 1

## 2019-06-14 MED ORDER — TRAVASOL 10 % IV SOLN
INTRAVENOUS | Status: AC
  Administered 2019-06-14: 18:00:00 via INTRAVENOUS
  Filled 2019-06-14: qty 460.8

## 2019-06-14 MED ORDER — TRAVASOL 10 % IV SOLN: INTRAVENOUS | Status: DC

## 2019-06-14 MED ORDER — INSULIN ASPART 100 UNIT/ML ~~LOC~~ SOLN
0.0000 [IU] | Freq: Four times a day (QID) | SUBCUTANEOUS | Status: DC
  Administered 2019-06-14: 1 [IU] via SUBCUTANEOUS
  Administered 2019-06-15: 19:00:00 2 [IU] via SUBCUTANEOUS
  Administered 2019-06-15 – 2019-06-16 (×2): 1 [IU] via SUBCUTANEOUS

## 2019-06-14 MED ORDER — SODIUM CHLORIDE 0.9 % IV SOLN
INTRAVENOUS | Status: DC
  Administered 2019-06-14 – 2019-06-21 (×5): via INTRAVENOUS

## 2019-06-14 NOTE — Progress Notes (Signed)
     Assessment & Plan: POD#1 - status post ex lap, partial colectomy with colostomy, small bowel resection  Operative findings - carcinomatosis, liver metastasis, malignant small bowel obstruction, large primary tumor in proximal transverse colon  NPO, NG decompression, IV hydration  Will increase frequency of morphine for pain control  OOB to chair today encouraged, SCD's, IS use  Oncology consultation when path available        Ellen Gemma, MD       [facility]       Office: [phone]   Chief Complaint: Metastatic colon carcinoma with small bowel obstruction  Subjective: Patient in bed, complains of pain, nausea.  Objective: Vital signs in last 24 hours: Temp:  [97.5 F (36.4 C)-99 F (37.2 C)] 98.1 F (36.7 C) (09/15 0550) Pulse Rate:  [72-107] 85 (09/15 0550) Resp:  [7-18] 17 (09/15 0550) BP: (116-156)/(46-96) 128/72 (09/15 0550) SpO2:  [95 %-100 %] 100 % (09/15 0550) Last BM Date: 06/05/19  Intake/Output from previous day: 09/14 0701 - 09/15 0700 In: 3441.9 [I.V.:2641.9; IV Piggyback:800] Out: 2850 [Urine:1700; Emesis/NG output:250; Blood:100] Intake/Output this shift: No intake/output data recorded.  Physical Exam: HEENT - sclerae clear, mucous membranes moist Neck - soft Chest - clear bilaterally Cor - RRR Abdomen - soft, midline wound dry and intact; ostomy viable Ext - no edema, non-tender Neuro - alert & oriented, no focal deficits  Lab Results:  Recent Labs    06/13/19 0350 06/14/19 0251  WBC 9.2 11.2*  HGB 10.7* 11.5*  HCT 33.6* 36.3  PLT 320 313   BMET Recent Labs    06/13/19 0350 06/14/19 0251  NA 139 135  K 3.9 4.2  CL 104 101  CO2 28 28  GLUCOSE 121* 181*  BUN <5* <5*  CREATININE 0.43* 0.33*  CALCIUM 7.9* 7.9*   PT/INR No results for input(s): LABPROT, INR in the last 72 hours. Comprehensive Metabolic Panel:    Component Value Date/Time   NA 135 06/14/2019 0251   NA 139 06/13/2019 0350   K 4.2  06/14/2019 0251   K 3.9 06/13/2019 0350   CL 101 06/14/2019 0251   CL 104 06/13/2019 0350   CO2 28 06/14/2019 0251   CO2 28 06/13/2019 0350   BUN <5 (L) 06/14/2019 0251   BUN <5 (L) 06/13/2019 0350   CREATININE 0.33 (L) 06/14/2019 0251   CREATININE 0.43 (L) 06/13/2019 0350   GLUCOSE 181 (H) 06/14/2019 0251   GLUCOSE 121 (H) 06/13/2019 0350   CALCIUM 7.9 (L) 06/14/2019 0251   CALCIUM 7.9 (L) 06/13/2019 0350   AST 14 (L) 06/14/2019 0251   AST 18 06/13/2019 0350   ALT 11 06/14/2019 0251   ALT 13 06/13/2019 0350   ALKPHOS 75 06/14/2019 0251   ALKPHOS 94 06/13/2019 0350   BILITOT 0.4 06/14/2019 0251   BILITOT 0.4 06/13/2019 0350   PROT 5.5 (L) 06/14/2019 0251   PROT 5.1 (L) 06/13/2019 0350   ALBUMIN 2.6 (L) 06/14/2019 0251   ALBUMIN 2.3 (L) 06/13/2019 0350    Studies/Results: No results found.    Ellen Larson 06/14/2019  Patient ID: Ellen Larson, female   DOB: Sep 07, 1956, 63 y.o.   MRN: [id number]

## 2019-06-14 NOTE — Progress Notes (Signed)
[place] NOTE   Pharmacy Consult for TPN Indication: anticipated prolonged PO intolerance  Patient Measurements: Height: 5\' 3"  (160 cm) Weight: 108 lb (49 kg) IBW/kg (Calculated) : 52.4 TPN AdjBW (KG): 49 Body mass index is 19.13 kg/m. Usual Weight: 58 kg  HPI: 72 yoF with PMH renal disease presents 9/11 with n/v and abdominal pain along with 20 lbs weight loss. Has not seen MD in > 20 yrs. CT reveals SBO d/t transverse colon mass; also noted to have omental and liver metastases. On 9/14 underwent ex lap with resection of ileum, partial colectomy, and ascending colostomy. Pharmacy consulted to start TPN while patient remains NPO  Central access: 2x lumen PICC placed 9/14 TPN start date: 9/15  Significant events:  - 9/14: to OR for primary procedure (see HPI)  ASSESSMENT                                                                                                           Today, 06/14/2019:  Glucose (CBG goal 100-150) - not currently checking CBGs; SBGs well controlled until today (likely d/t Decadron in OR)  No Hx DM  Electrolytes - all WNL; has required Phos, K, and Mag supplementation prior to starting TPN  Renal - SCr and BUN previously normal, now both low; bicarb improved to WNL; good UOP  I/O - NG output up from yesterday  LFTs - WNL except albumin low/stable  TGs - pending  Prealbumin - pending  Current Nutrition - NPO, dextrose and KCl in IVF  IVF - D5-1/2NS + 20 K @ 100 ml/hr  NUTRITIONAL GOALS                                                                                             RD recs (9/13): Kcal:  1715-1960 kcal Protein:  78-88 grams Fluid:  >/= 1.8 L/day  PLAN                                                                                                                         At 1800 today:  Start TPN at 40 ml/hr; will hold off on aggressive advancement  of TPN as I suspect patient will be  advanced to CLD soon  TPN at goal rate of 75 ml/hr provides 86 g of protein and 1803 kcals, meeting 100% of patient needs  TPN to contain MVI and trace elements daily  Electrolytes: increase Mg; no other changes; Cl:Ac 1:1  Reduce IVF to 60 ml/hr; remove dextrose and KCl  Begin sensitive SSI with q6 hr CBG checks  TPN lab panels on Mondays & Thursdays  Reuel Boom, PharmD, BCPS [phone] 06/14/2019, 12:02 PM

## 2019-06-14 NOTE — Care Management Important Message (Signed)
Important Message  Patient Details IM Letter given to Kathrin Greathouse SW to present to the Patient Name: Ellen Larson MRN: [id number] Date of Birth: 12/13/1955   Medicare Important Message Given:  Yes     Kerin Salen 06/14/2019, 11:26 AM

## 2019-06-14 NOTE — Care Management Important Message (Signed)
Important Message  Patient Details IM Letter given to Kathrin Greathouse SW to present to the Patient Name: Ellen Larson MRN: [id number] Date of Birth: 11/01/1955   Medicare Important Message Given:  Yes     Kerin Salen 06/14/2019, 10:49 AM

## 2019-06-14 NOTE — Progress Notes (Signed)
PROGRESS NOTE    Ellen Larson  [id number] DOB: [birth year] DOA: 06/10/2019 PCP: Patient, No Pcp Per   Brief Narrative:  Patient is a 63 year old Caucasian female with a past medical history of renal disease and no other past medical history presented to the ED with nausea vomiting and abdominal pain as well as unexplained significant weight loss.  She is unable to keep her food down next extensively and has been having intermittent nausea vomiting over the last 6 weeks which is gotten worse.  She has not seen a doctor in over 20 years as well.  She was seen in outpatient setting for an umbilical hernia referred to general surgery and sent to the ER based on her history.  In the ER she was evaluated and found to have a small bowel obstruction and a colonic malignancy likely with metastasis.  She has been having loose stools mainly with small formed stools and last bowel movement was yesterday.    General Surgery evaluated and she is currently n.p.o. and having IV fluids and supportive care.  Dr. 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  He feels that she will likely require diverting colostomy if it is even possible to work an abdominal cavity and she will be started on TPN once PICC is placed.  PICC was placed and TPN is to be started. She is POD1 from an exploratory laparotomy, partial colectomy with colostomy and a small bowel resection.  Intraoperative findings included carcinomatosis, liver metastasis and liver small bowel obstruction with a large primary tumor in the proximal transverse colon  Assessment & Plan:   Principal Problem:   SBO (small bowel obstruction) (HCC) Active Problems:   Hypokalemia   Weight loss   Suspected Colon cancer metastasized to liver (HCC)  Small  Bowel Obstruction, persistent status post exploratory laparotomy with small bowel obstruction with primary anastomosis and partial colectomy with sending colostomy -Suspected secondary to colon malignancy.  -Has never had colonoscopy.  -C/w IV fluids and supportive care. -Continue with antiemetics with ondansetron 4 mg p.o./IV every 6 as needed for nausea and pain control with IV morphine 2 g every 4 PRN for severe pain -We will place PICC line and start TPN based on general surgery recommendations and PICC is still to be placed -Repeat KUB yesterday AM showed "Some gas and stool are noted in the colon and distal rectum. However, there continue to be multiple dilated loops of small bowel in the central abdomen measuring up to 5.1 cm in diameter, compatible with persistent partial small bowel obstruction. No gross evidence of pneumoperitoneum on this single supine view."  This is compatible with a persistent SBO -Appreciate further surgical care and she went for Surgical Intervention and had an exploratory laparotomy, small bowel resection with primary anastomosis and partial colectomy with end colostomy -PICC line was placed and patient is to be started on TPN -Currently has NG tube in place and will have bowel rest; general surgery recommending continue n.p.o., NG depression and IV fluid hydration -We will need to follow-up on surgical pathology results -She has a PCA pump per general surgery for pain control but thinks it is too much as it "knocked her out of her head" and last night she became more somnolent and had a decreased respiratory rate so she  was changed to IV morphine -We will continue out of bed to chair and ambulation SCDs and incentive spirometer -Consult pathology results we will continue medical oncology consultation  Suspected Colon Cancer with Metastasis -Patient will require colonoscopy at some point with biopsy but after discussion with GI and general surgery She went for  Surgical Intervention  -She is POD1, exploratory laparotomy with small bowel resection with primary anastomosis and partial colectomy with ascending colostomy -Partial colostomy and segmental proximal ileum was sent to pathology and still pending -Postoperative diagnosis showed that she had a 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  Hypophosphatemia -Patient's Phos Level this AM was 2.6 -Continue to Monitor and Replete as Necessary -Repeat Phos Level in AM   Unintentional weight loss -Most likely secondary to suspected cancer as well as anorexia.  -Nutrition counseling with addressing underlying cause. -We will place a PICC line for TPN and will call dietitian for further evaluation recommendations -IV fluids now changed to normal saline at 60 mL's per hour as she is getting TPN  Hyponatremia/Hypochloremia, improved  -In setting of poor p.o. intake -IV fluids have been now changed to normal saline at 60 mL's per hour -Patient's sodium is now 135 and chloride is 101 -Repeat CMP in the a.m.  Anion Gap Metabolic Acidosis -Continue with supportive care and IV fluids as above; Now improved  -Patient CO2 is now 28 and anion gap is 6 -C/w IVF hydration as above -Continue to monitor and trend and repeat CMP in a.m.  Leukocytosis -WBC went from 12.3 -> 11.5 -> 10.7 -> 9.2 -> 11.2 -Initially from SBO now from  surgical intervention -Continue to Monitor for S/Sx of Infection -Repeat CBC in AM   Thrombocytosis, improved  -Patient's Platelet Count went to 414 and is now 313 -Likely Reactive -Continue to Monitor and Trend -Repeat CBC in AM   Normocytic Anemia -Patient's hemoglobin/hematocrit was normal on admission had dropped to 10.7/33.6 is now 11.5/36.3 -Question of post operative anemia had role however do not feel that this is the case and likely had a dilutional drop as hemoglobin is improved from yesterday -Check anemia panel in the a.m. -Continue to monitor for signs and symptoms of bleeding; currently no overt bleeding noted -Repeat CBC in a.m.   DVT prophylaxis: Heparin 5,000 units sq q8h Code Status: FULL CODE Family Communication: Discussed with nephew at bedside Disposition Plan: Pending further Surgical Evaluation and Recc's and evaluation by medical oncology eventually  Consultants:   General Surgery   Procedures:  POD#1 - status post ex lap, partial colectomy with colostomy, small bowel resection             Operative findings - carcinomatosis, liver metastasis, malignant small bowel obstruction, large primary tumor in proximal transverse colon  Antimicrobials:  Anti-infectives (From admission, onward)   Start     Dose/Rate Route Frequency Ordered Stop   06/13/19 0933  ertapenem (INVANZ) 1,000 mg in sodium  chloride 0.9 % 100 mL IVPB     1 g 200 mL/hr over 30 Minutes Intravenous 30 min pre-op 06/13/19 0930 06/13/19 1042     Subjective: Seen and examined at bedside she is still complaining of some abdominal discomfort and pain.  Had NG tube in place and was feeling fatigued and tired.  No chest pain, lightheadedness or dizziness.  States that she had a rough night last night.  No other concerns or complaints at this time.  Objective: Vitals:   06/14/19 0135 06/14/19 0550 06/14/19 0918 06/14/19 1317  BP: (!) 122/52 128/72 (!) 109/58 123/63  Pulse: 72 85 83 83  Resp:  17 17 16 16   Temp: 97.7 F (36.5 C) 98.1 F (36.7 C) 98.2 F (36.8 C) 98.1 F (36.7 C)  TempSrc:  Oral    SpO2: 100% 100% 99% 99%  Weight:      Height:        Intake/Output Summary (Last 24 hours) at 06/14/2019 1652 Last data filed at 06/14/2019 1015 Gross per 24 hour  Intake 2016.7 ml  Output 2550 ml  Net -533.3 ml   Filed Weights   06/10/19 1208  Weight: 49 kg   Examination: Physical Exam:  Constitutional: Thin Caucasian female currently resting and appears to be in some pain and looks uncomfortable  Eyes: Lids and conjunctivae normal, sclerae anicteric  ENMT: External Ears, Nose appear normal. Grossly normal hearing. NGT in place hooked to suction Neck: Appears normal, supple, no cervical masses, normal ROM, no appreciable thyromegaly; no JVD Respiratory: Diminished to auscultation bilaterally, no wheezing, rales, rhonchi or crackles. Normal respiratory effort and patient is not tachypenic. No accessory muscle use.  Cardiovascular: RRR, no murmurs / rubs / gallops. S1 and S2 auscultated. No extremity edema. 2+ pedal pulses. No carotid bruits.  Abdomen: Soft, tender, slightly distended. No masses palpated.  Has a midline incision and a colostomy bag in place.  Bowel sounds positive diminished.  GU: Deferred. Musculoskeletal: No clubbing / cyanosis of digits/nails. No joint deformity upper and lower extremities. Good ROM, no contractures. Normal strength and muscle tone.  Skin: No rashes, lesions, ulcers on a limited skin evaluation and abdominal dressing has some blood on it. No induration; Warm and dry.  Neurologic: CN 2-12 grossly intact with no focal deficits.  Romberg sign cerebellar reflexes not assessed.  Psychiatric: Normal judgment and insight. A Little drowsy.  Not anxious mood and appropriate affect.   Data Reviewed: I have personally reviewed following labs and imaging studies  CBC: Recent Labs  Lab 06/10/19 1248 06/11/19 0613 06/12/19 0324 06/13/19 0350  06/14/19 0251  WBC 12.3* 11.5* 10.7* 9.2 11.2*  NEUTROABS  --   --  7.5 6.2 9.1*  HGB 13.1 12.0 12.3 10.7* 11.5*  HCT 40.2 36.6 38.9 33.6* 36.3  MCV 82.9 83.8 84.6 84.4 85.0  PLT 440* 361 414* 320 [id number]   Basic Metabolic Panel: Recent Labs  Lab 06/10/19 1248 06/11/19 0613 06/12/19 0324 06/13/19 0350 06/14/19 0251  NA 133* 134* 138 139 135  K 2.8* 3.1* 3.5 3.9 4.2  CL 87* 96* 100 104 101  CO2 25 20* 25 28 28   GLUCOSE 107* 146* 137* 121* 181*  BUN 12 7* <5* <5* <5*  CREATININE 0.61 0.44 0.47 0.43* 0.33*  CALCIUM 8.9 8.2* 8.5* 7.9* 7.9*  MG  --  1.8 1.7 2.2 1.8  PHOS  --  2.7 2.0* 3.2 2.6   GFR: Estimated Creatinine Clearance: 54.2 mL/min (A) (by C-G formula based on  SCr of 0.33 mg/dL (L)). Liver Function Tests: Recent Labs  Lab 06/10/19 1248 06/11/19 0613 06/12/19 0324 06/13/19 0350 06/14/19 0251  AST 26 19 19 18  14*  ALT 25 19 17 13 11   ALKPHOS 145* 123 112 94 75  BILITOT 1.0 1.0 0.4 0.4 0.4  PROT 7.1 6.0* 5.9* 5.1* 5.5*  ALBUMIN 3.3* 2.7* 2.7* 2.3* 2.6*   Recent Labs  Lab 06/10/19 1248  LIPASE 22   No results for input(s): AMMONIA in the last 168 hours. Coagulation Profile: No results for input(s): INR, PROTIME in the last 168 hours. Cardiac Enzymes: No results for input(s): CKTOTAL, CKMB, CKMBINDEX, TROPONINI in the last 168 hours. BNP (last 3 results) No results for input(s): PROBNP in the last 8760 hours. HbA1C: No results for input(s): HGBA1C in the last 72 hours. CBG: Recent Labs  Lab 06/13/19 0400  GLUCAP 109*   Lipid Profile: No results for input(s): CHOL, HDL, LDLCALC, TRIG, CHOLHDL, LDLDIRECT in the last 72 hours. Thyroid Function Tests: No results for input(s): TSH, T4TOTAL, FREET4, T3FREE, THYROIDAB in the last 72 hours. Anemia Panel: No results for input(s): VITAMINB12, FOLATE, FERRITIN, TIBC, IRON, RETICCTPCT in the last 72 hours. Sepsis Labs: No results for input(s): PROCALCITON, LATICACIDVEN in the last 168 hours.  Recent Results  (from the past 240 hour(s))  SARS Coronavirus 2 [facility] order, Performed in [facility] hospital lab) Nasopharyngeal Nasopharyngeal Swab     Status: None   Collection Time: 06/11/19  1:02 AM   Specimen: Nasopharyngeal Swab  Result Value Ref Range Status   SARS Coronavirus 2 NEGATIVE NEGATIVE Final    Comment: (NOTE) If result is NEGATIVE SARS-CoV-2 target nucleic acids are NOT DETECTED. The SARS-CoV-2 RNA is generally detectable in upper and lower  respiratory specimens during the acute phase of infection. The lowest  concentration of SARS-CoV-2 viral copies this assay can detect is 250  copies / mL. A negative result does not preclude SARS-CoV-2 infection  and should not be used as the sole basis for treatment or other  patient management decisions.  A negative result may occur with  improper specimen collection / handling, submission of specimen other  than nasopharyngeal swab, presence of viral mutation(s) within the  areas targeted by this assay, and inadequate number of viral copies  (<250 copies / mL). A negative result must be combined with clinical  observations, patient history, and epidemiological information. If result is POSITIVE SARS-CoV-2 target nucleic acids are DETECTED. The SARS-CoV-2 RNA is generally detectable in upper and lower  respiratory specimens dur ing the acute phase of infection.  Positive  results are indicative of active infection with SARS-CoV-2.  Clinical  correlation with patient history and other diagnostic information is  necessary to determine patient infection status.  Positive results do  not rule out bacterial infection or co-infection with other viruses. If result is PRESUMPTIVE POSTIVE SARS-CoV-2 nucleic acids MAY BE PRESENT.   A presumptive positive result was obtained on the submitted specimen  and confirmed on repeat testing.  While 2019 novel coronavirus  (SARS-CoV-2) nucleic acids may be present in the submitted sample  additional  confirmatory testing may be necessary for epidemiological  and / or clinical management purposes  to differentiate between  SARS-CoV-2 and other Sarbecovirus currently known to infect humans.  If clinically indicated additional testing with an alternate test  methodology [phone]) is advised. The SARS-CoV-2 RNA is generally  detectable in upper and lower respiratory sp ecimens during the acute  phase of infection. The expected  result is Negative. Fact Sheet for Patients:  StrictlyIdeas.no Fact Sheet for Healthcare Providers: BankingDealers.co.za This test is not yet approved or cleared by the [place] FDA and has been authorized for detection and/or diagnosis of SARS-CoV-2 by FDA under an Emergency Use Authorization (EUA).  This EUA will remain in effect (meaning this test can be used) for the duration of the COVID-19 declaration under Section 564(b)(1) of the Act, 21 U.S.C. section 360bbb-3(b)(1), unless the authorization is terminated or revoked sooner. Performed at [facility], [place] [address], [address], [place] [address]     Radiology Studies: No results found. Scheduled Meds:  Chlorhexidine Gluconate Cloth  6 each Topical Daily   enoxaparin (LOVENOX) injection  40 mg Subcutaneous Q24H   [START ON 06/15/2019] insulin aspart  0-9 Units Subcutaneous Q6H   sodium chloride flush  10-40 mL Intracatheter Q12H   sodium chloride flush  3 mL Intravenous Once   Continuous Infusions:  sodium chloride     acetaminophen 1,000 mg (06/14/19 1153)   dextrose 5 % and 0.45 % NaCl with KCl 20 mEq/L 100 mL/hr at 06/14/19 1149   TPN ADULT (ION)      LOS: 3 days   Kerney Elbe, DO [facility] PAGER is on [place]  If 7PM-7AM, please contact night-coverage [email or web address] Password [facility] 06/14/2019, 4:52 PM

## 2019-06-15 DIAGNOSIS — D649 Anemia, unspecified: Secondary | ICD-10-CM | POA: Diagnosis present

## 2019-06-15 LAB — COMPREHENSIVE METABOLIC PANEL
ALT: 11 U/L (ref 0–44)
AST: 15 U/L (ref 15–41)
Albumin: 2.1 g/dL — ABNORMAL LOW (ref 3.5–5.0)
Alkaline Phosphatase: 72 U/L (ref 38–126)
Anion gap: 7 (ref 5–15)
BUN: 8 mg/dL (ref 8–23)
CO2: 24 mmol/L (ref 22–32)
Calcium: 7.8 mg/dL — ABNORMAL LOW (ref 8.9–10.3)
Chloride: 100 mmol/L (ref 98–111)
Creatinine, Ser: 0.33 mg/dL — ABNORMAL LOW (ref 0.44–1.00)
GFR calc Af Amer: >60 mL/min (ref >60)
GFR calc non Af Amer: >60 mL/min (ref >60)
Glucose, Bld: 115 mg/dL — ABNORMAL HIGH (ref 70–99)
Potassium: 3.6 mmol/L (ref 3.5–5.1)
Sodium: 131 mmol/L — ABNORMAL LOW (ref 135–145)
Total Bilirubin: 0.3 mg/dL (ref 0.3–1.2)
Total Protein: 4.7 g/dL — ABNORMAL LOW (ref 6.5–8.1)

## 2019-06-15 LAB — CBC WITH DIFFERENTIAL/PLATELET
Abs Immature Granulocytes: 0.06 10*3/uL (ref 0.00–0.07)
Basophils Absolute: 0 10*3/uL (ref 0.0–0.1)
Basophils Relative: 0 %
Eosinophils Absolute: 0.3 10*3/uL (ref 0.0–0.5)
Eosinophils Relative: 2 %
HCT: 33 % — ABNORMAL LOW (ref 36.0–46.0)
Hemoglobin: 10.5 g/dL — ABNORMAL LOW (ref 12.0–15.0)
Immature Granulocytes: 1 %
Lymphocytes Relative: 11 %
Lymphs Abs: 1.2 10*3/uL (ref 0.7–4.0)
MCH: 27.3 pg (ref 26.0–34.0)
MCHC: 31.8 g/dL (ref 30.0–36.0)
MCV: 85.9 fL (ref 80.0–100.0)
Monocytes Absolute: 0.7 10*3/uL (ref 0.1–1.0)
Monocytes Relative: 6 %
Neutro Abs: 9.3 10*3/uL — ABNORMAL HIGH (ref 1.7–7.7)
Neutrophils Relative %: 80 %
Platelets: 316 10*3/uL (ref 150–400)
RBC: 3.84 MIL/uL — ABNORMAL LOW (ref 3.87–5.11)
RDW: 13.3 % (ref 11.5–15.5)
WBC: 11.5 10*3/uL — ABNORMAL HIGH (ref 4.0–10.5)
nRBC: 0 % (ref 0.0–0.2)

## 2019-06-15 LAB — GLUCOSE, CAPILLARY
Glucose-Capillary: 113 mg/dL — ABNORMAL HIGH (ref 70–99)
Glucose-Capillary: 116 mg/dL — ABNORMAL HIGH (ref 70–99)
Glucose-Capillary: 163 mg/dL — ABNORMAL HIGH (ref 70–99)
Glucose-Capillary: 144 mg/dL — ABNORMAL HIGH (ref 70–99)

## 2019-06-15 LAB — PREALBUMIN: Prealbumin: <5 mg/dL — ABNORMAL LOW (ref 18–38)

## 2019-06-15 LAB — MAGNESIUM: Magnesium: 1.9 mg/dL (ref 1.7–2.4)

## 2019-06-15 LAB — TRIGLYCERIDES: Triglycerides: 81 mg/dL (ref <150)

## 2019-06-15 LAB — PHOSPHORUS: Phosphorus: 2.3 mg/dL — ABNORMAL LOW (ref 2.5–4.6)

## 2019-06-15 MED ORDER — METHOCARBAMOL 1000 MG/10ML IJ SOLN
500.0000 mg | Freq: Four times a day (QID) | INTRAVENOUS | Status: DC
  Administered 2019-06-15 – 2019-06-16 (×6): 500 mg via INTRAVENOUS
  Filled 2019-06-15: qty 5
  Filled 2019-06-15: qty 500
  Filled 2019-06-15: qty 5
  Filled 2019-06-15: qty 500
  Filled 2019-06-15 (×5): qty 5
  Filled 2019-06-15: qty 500
  Filled 2019-06-15 (×2): qty 5
  Filled 2019-06-15: qty 500
  Filled 2019-06-15 (×2): qty 5
  Filled 2019-06-15: qty 500
  Filled 2019-06-15 (×4): qty 5
  Filled 2019-06-15: qty 500
  Filled 2019-06-15: qty 5

## 2019-06-15 MED ORDER — ACETAMINOPHEN 10 MG/ML IV SOLN
1000.0000 mg | Freq: Four times a day (QID) | INTRAVENOUS | Status: AC
  Administered 2019-06-15 – 2019-06-16 (×4): 1000 mg via INTRAVENOUS
  Filled 2019-06-15 (×4): qty 100

## 2019-06-15 MED ORDER — TRAVASOL 10 % IV SOLN
INTRAVENOUS | Status: AC
  Administered 2019-06-15: 18:00:00 via INTRAVENOUS
  Filled 2019-06-15: qty 960

## 2019-06-15 MED ORDER — POTASSIUM PHOSPHATES 15 MMOLE/5ML IV SOLN
15.0000 mmol | Freq: Once | INTRAVENOUS | Status: AC
  Administered 2019-06-15: 15 mmol via INTRAVENOUS
  Filled 2019-06-15: qty 5

## 2019-06-15 NOTE — Plan of Care (Signed)
  Problem: Education: Goal: Knowledge of General Education information will improve Description: Including pain rating scale, medication(s)/side effects and non-pharmacologic comfort measures Outcome: Progressing   Problem: Coping: Goal: Level of anxiety will decrease Outcome: Progressing   Problem: Pain Managment: Goal: General experience of comfort will improve Outcome: Progressing   

## 2019-06-15 NOTE — Progress Notes (Addendum)
[facility] Progress Note  2 Days Post-Op  Subjective: CC-  Continues to complain of abdominal pain. Morphine helps but does not last very long. Pain is crampy and intermittent this morning. No bowel function. Denies n/v. NG with 450cc out last 24 hours. States that she feels very weak and gets tired just rolling in bed.  Objective: Vital signs in last 24 hours: Temp:  [98.1 F (36.7 C)-98.5 F (36.9 C)] 98.5 F (36.9 C) (09/16 0543) Pulse Rate:  [78-91] 91 (09/16 0543) Resp:  [16] 16 (09/16 0543) BP: (121-138)/(63-85) 138/85 (09/16 0543) SpO2:  [98 %-100 %] 100 % (09/16 0543) Last BM Date: 06/05/19  Intake/Output from previous day: 09/15 0701 - 09/16 0700 In: 2774.3 [I.V.:2309.3; IV Piggyback:465] Out: 2000 [Urine:1300; Emesis/NG output:700] Intake/Output this shift: No intake/output data recorded.  PE: Gen:  Alert, NAD, pleasant HEENT: EOM's intact, pupils equal and round Pulm:  Rate and effort normal Abd: Soft, appropriately tender, hypoactive BS, midline incision cdi with staples intact and honeycomb dressing in place, ostomy viable with no air or stool in pouch Skin: warm and dry  Lab Results:  Recent Labs    06/14/19 0251 06/15/19 0418  WBC 11.2* 11.5*  HGB 11.5* 10.5*  HCT 36.3 33.0*  PLT 313 316   BMET Recent Labs    06/14/19 0251 06/15/19 0418  NA 135 131*  K 4.2 3.6  CL 101 100  CO2 28 24  GLUCOSE 181* 115*  BUN <5* 8  CREATININE 0.33* 0.33*  CALCIUM 7.9* 7.8*   PT/INR No results for input(s): LABPROT, INR in the last 72 hours. CMP     Component Value Date/Time   NA 131 (L) 06/15/2019 0418   K 3.6 06/15/2019 0418   CL 100 06/15/2019 0418   CO2 24 06/15/2019 0418   GLUCOSE 115 (H) 06/15/2019 0418   BUN 8 06/15/2019 0418   CREATININE 0.33 (L) 06/15/2019 0418   CALCIUM 7.8 (L) 06/15/2019 0418   PROT 4.7 (L) 06/15/2019 0418   ALBUMIN 2.1 (L) 06/15/2019 0418   AST 15 06/15/2019 0418   ALT 11 06/15/2019 0418   ALKPHOS 72  06/15/2019 0418   BILITOT 0.3 06/15/2019 0418   GFRNONAA >60 06/15/2019 0418   GFRAA >60 06/15/2019 0418   Lipase     Component Value Date/Time   LIPASE 22 06/10/2019 1248       Studies/Results: No results found.  Anti-infectives: Anti-infectives (From admission, onward)   Start     Dose/Rate Route Frequency Ordered Stop   06/13/19 0933  ertapenem (INVANZ) 1,000 mg in sodium chloride 0.9 % 100 mL IVPB     1 g 200 mL/hr over 30 Minutes Intravenous 30 min pre-op 06/13/19 0930 06/13/19 1042       Assessment/Plan  Malnutrition - prealbumin <5 (9/16), continue TPN  Transverse colon carcinoma with omental metastasis, peritoneal carcinomatosis, liver metastasis, malignant small bowel obstruction S/p Exploratory laparotomy, small bowel resection with primary anastomosis, partial colectomy with ascending colostomy 9/14 Dr. Harlow Asa - POD #2 - surgical pathology pending - Will need oncology follow up once path is back - ileus  ID - invanz periop. WBC 11.5, monitor. afebrile FEN - IVF, NPO/NGT, TPN VTE - SCDs, lovenox Foley - d/c 9/16 Follow up - Dr. Harlow Asa  Plan: Continue NPO/NGT and await return in bowel function. Needs to mobilize more. Add PT consult. Schedule IV tylenol and IV robaxin for better pain control. Will ask WOC RN to see for new ostomy. D/c foley.  LOS: 4 days    Wellington Hampshire , [facility] 06/15/2019, 9:36 AM Pager: [phone] Mon-Thurs 7:00 am-4:30 pm Fri 7:00 am -11:30 AM Sat-Sun 7:00 am-11:30 am

## 2019-06-15 NOTE — Evaluation (Signed)
Physical Therapy Evaluation Patient Details Name: Ellen Larson MRN: [id number] DOB: November 17, 1955 Today's Date: 06/15/2019   History of Present Illness  63 y.o. female s/p Exploratory laparotomy, small bowel resection with primary anastomosis, partial colectomy with acsending colostomy    Clinical Impression  Ellen Larson is a 63 y.o. female admitted with above diagnosis and HPI. She reports independence with all mobility and ADL's at baseline with no device. Pt requires min guard for sit<>stand transfers today and min assist for gait and was able to ambulate a short distance in hall, ~ 40'. She required use of IV pole and 1 HHA to steady balance during mobility. Patient's family reports they will be available to assist her and provided supervision if she returns home. She would benefit from use of [facility] for mobility at this time. Acute PT will follow and progress as able.    Follow Up Recommendations Home health PT;Supervision for mobility/OOB;SNF(home with HHPT and supervision for mobility, if not available from family recommend SNF)    Equipment Recommendations  None recommended by PT(pt could use SPC, shuld buy herself)    Recommendations for Other Services       Precautions / Restrictions Precautions Precautions: Fall Restrictions Weight Bearing Restrictions: No      Mobility  Bed Mobility Overal bed mobility: Needs Assistance Bed Mobility: Sit to Supine       Sit to supine: Supervision   General bed mobility comments: pt seated at EOB at arrival, pt able to lift LE's into bed without assist at EOS  Transfers Overall transfer level: Needs assistance Equipment used: 1 person hand held assist Transfers: Sit to/from Stand Sit to Stand: Min guard            Ambulation/Gait Ambulation/Gait assistance: Min assist Gait Distance (Feet): 40 Feet Assistive device: 1 person hand held assist;IV Pole Gait Pattern/deviations: Step-through pattern;Decreased step length -  right;Decreased step length - left;Decreased stride length;Drifts right/left;Narrow base of support Gait velocity: decreased   General Gait Details: Patient able to ambulate short steps in room with no UE support but unsteady and low confidence, pt with improved steadiness with UE support on IV pole and 1 HHA for short distacne in hallway, pt slight reported reduction in stomach discomfort with walking  Stairs            Wheelchair Mobility    Modified Rankin (Stroke Patients Only)       Balance Overall balance assessment: Needs assistance Sitting-balance support: Feet supported;No upper extremity supported Sitting balance-Leahy Scale: Good       Standing balance-Leahy Scale: Fair Standing balance comment: pt can perform standing static balance without support, requries UE support for dynamic standing and gait            Pertinent Vitals/Pain Pain Assessment: Faces Faces Pain Scale: Hurts even more Pain Location: abdomen Pain Descriptors / Indicators: Aching Pain Intervention(s): Limited activity within patient's tolerance;Monitored during session;Repositioned    Home Living Family/patient expects to be discharged to:: Private residence Living Arrangements: Other (Comment)(Lives with a roommate) Available Help at Discharge: Family;Available 24 hours/day Type of Home: House Home Access: Stairs to enter Entrance Stairs-Rails: None Entrance Stairs-Number of Steps: 6 with no rail at front, and 1 step at back door (threshhold) Home Layout: Two level;Able to live on main level with bedroom/bathroom;Full bath on main level Home Equipment: None Additional Comments: pt's daughter and son-in-law are available to stay with pt during the day    Prior Function Level of Independence: Independent  Comments: pt is independent with all ADL's, all mobility     Hand Dominance   Dominant Hand: Right    Extremity/Trunk Assessment   Upper Extremity  Assessment Upper Extremity Assessment: Overall WFL for tasks assessed    Lower Extremity Assessment Lower Extremity Assessment: Generalized weakness    Cervical / Trunk Assessment Cervical / Trunk Assessment: Normal  Communication   Communication: No difficulties  Cognition Arousal/Alertness: Awake/alert Behavior During Therapy: WFL for tasks assessed/performed Overall Cognitive Status: Within Functional Limits for tasks assessed           General Comments      Exercises General Exercises - Lower Extremity Ankle Circles/Pumps: AROM;Both;Supine;10 reps Long [employer]: (educated pt on exercise) Straight Leg Raises: AROM;Both;5 reps;Supine   Assessment/Plan    PT Assessment Patient needs continued PT services  PT Problem List Decreased strength;Decreased range of motion;Decreased activity tolerance;Decreased balance;Decreased mobility       PT Treatment Interventions DME instruction;Gait training;Functional mobility training;Therapeutic exercise;Patient/family education;Stair training;Therapeutic activities;Balance training    PT Goals (Current goals can be found in the Care Plan section)  Acute Rehab PT Goals Patient Stated Goal: pt hoping to get stronger and improve her balance and walking to go home PT Goal Formulation: With patient Time For Goal Achievement: 06/29/19 Potential to Achieve Goals: Good    Frequency Min 3X/week    AM-PAC PT "6 Clicks" Mobility  Outcome Measure Help needed turning from your back to your side while in a flat bed without using bedrails?: A Little Help needed moving from lying on your back to sitting on the side of a flat bed without using bedrails?: A Little Help needed moving to and from a bed to a chair (including a wheelchair)?: A Little Help needed standing up from a chair using your arms (e.g., wheelchair or bedside chair)?: A Little Help needed to walk in hospital room?: A Little Help needed climbing 3-5 steps with a railing? : A  Lot 6 Click Score: 17    End of Session Equipment Utilized During Treatment: Gait belt Activity Tolerance: Patient tolerated treatment well Patient left: in bed;with call bell/phone within reach;with family/visitor present;with SCD's reapplied Nurse Communication: Mobility status PT Visit Diagnosis: Unsteadiness on feet (R26.81);Difficulty in walking, not elsewhere classified (R26.2)    Time: [id number] PT Time Calculation (min) (ACUTE ONLY): 27 min   Charges:   PT Evaluation $PT Eval Moderate Complexity: 1 Mod          Kipp Brood, PT, DPT, [facility] Physical Therapist with [facility]  06/15/2019 3:43 PM

## 2019-06-15 NOTE — Progress Notes (Addendum)
Nutrition Follow-up  RD working remotely.   DOCUMENTATION CODES:   (unable to assess for malnutrition at this time.)  INTERVENTION:  - continue TPN per Pharmacy. - diet advancement as medically feasible. - weigh patient today.   Monitor magnesium, potassium, and phosphorus daily for at least 3 days, MD to replete as needed, as pt is at risk for refeeding syndrome given current mild hypophosphatemia, likely malnutrition.   NUTRITION DIAGNOSIS:   Inadequate oral intake related to inability to eat as evidenced by NPO status. -ongoing  GOAL:   Patient will meet greater than or equal to 90% of their needs -unmet with current TPN rate  MONITOR:   Diet advancement, Labs, Weight trends, Skin, Other (Comment)(TPN regimen)  ASSESSMENT:   63 y.o. female with no significant past medical history mainly previous renal disease. She presented to the ED on 9/11 with N/V, abdominal pain, and significant weight loss over the past 6 weeks. She reported symptoms have been acutely worse. Patient reported not seeing a doctor in over 20 years. In the ED she was found to have SBO and possible colon malignancy with mets. She has been mainly having loose stools, no formed stools, but not overt diarrhea. Surgery consulted.  Significant Events: 9/11- admission 9/14- ex lap, small bowel resection with primary anastomosis, partial colectomy with colostomy; double lumen PICC placed in R basilic; NGT placed in R nare 9/15- TPN initiation    Patient has not been weighed since admission. She remains NPO with NGT to LIS and flow sheet indicates 450 ml output overnight and 250 ml output this AM. TPN was started yesterday and she is currently receiving custom TPN at 40 ml/hr with plan for slow advancement as anticipate CLD will be ordered soon. Current goal for TPN is custom TPN at 75 ml/hr which will provide 1803 kcal, 86 grams protein. Estimated nutrition needs updated this AM d/t surgery on 9/14 and cancer dx.    Suspect some degree of malnutrition but unable to confirm without NFPE.   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 Medications reviewed; sliding scale novolog. IVF; NS @ 60 ml/hr.     Diet Order:   Diet Order            Diet NPO time specified Except for: Ice Chips, Sips with Meds  Diet effective now              EDUCATION NEEDS:   Not appropriate for education at this time  Skin:  Skin Assessment: Skin Integrity Issues: Skin Integrity Issues:: Incisions Incisions: abdomen (9/14)  Last BM:  9/10 (day PTA, per patient)  Height:   Ht Readings from Last 1 Encounters:  06/10/19 5\' 3"  (1.6 m)    Weight:   Wt Readings from Last 1 Encounters:  06/10/19 49 kg    Ideal Body Weight:  52.3 kg  BMI:  Body mass index is 19.13 kg/m.  Estimated Nutritional Needs:   Kcal:  1900-2100 kcal  Protein:  95-105 grams  Fluid:  >/= 1.8 L/day     Jarome Matin, MS, RD, LDN, [facility] Inpatient Clinical Dietitian Pager # [phone] After hours/weekend pager # [phone]

## 2019-06-15 NOTE — Progress Notes (Signed)
[facility]  PROGRESS NOTE  Niah Sarnowski [id number] DOB: March 17, 1956 DOA: 06/10/2019 PCP: Patient, No Pcp Per  Brief History    Ajana Ditsworth is a 63 y.o. year old female with no significant medical history  who presented on 06/10/2019 with nausea, vomiting, abdominal pain and unexplained significant weight loss over several weeks.  She presented to ED after outpatient evaluation for umbilical hernia based on her symptoms was sent to the ED, patient was found to have colonic wall thickening highly suspicious for primary colonic malignancy and concern for mesenteric implant causing small bowel obstruction.  Patient underwent exploratory laparotomy with small bowel resection and primary anastomosis/partial colectomy with colostomy placed on 9/14  A & P     Malignant small bowel obstruction, due to persistent SBO underwent partial colectomy with colostomy and small bowel resection on 9/14.  Given recent abdominal surgery patient started on TPN on 9/15 for nutritional support until bowel function returns.  Continue IV fluid hydration, surgery following.  Judicious use of pain control as patient became very somnolent with PCA pump earlier in hospitalization, currently on IV morphine will closely monitor respiratory status/mental status , NG decompression.  Surgery added IV scheduled Tylenol and Robaxin for pain control.   Transverse colon carcinoma with omental metastasis, peritoneal carcinomatosis, liver metastasis.  Operative findings with large primary tumor in proximal transverse colon.  Pathology pending, consult oncology once available   Hypokalemia.  Receiving repletion IV, closely monitor while on TPN   Hypophosphatemia.  Receiving repletion IV, closely monitor while on TPN   Unintentional weight loss, likely secondary to malignancy.  Prealbumin less than 5, TPN added for nutritional support, dietary consulted   Normocytic anemia hemoglobin remained stable at 10.5.  likely related to suspected GI malignancy.  Closely monitor, no active signs or symptoms of bleeding.     DVT prophylaxis: lovenox Code Status: Full Family Communication: No family at bedside Disposition Plan: Needs continued inpatient stay given need for TPN while awaiting bowel function return, close monitoring of electrolytes/nutrients requiring IV repletion and IV pain control     [facility] Direct contact: see [email or web address] (further directions at bottom of note if needed) 7PM-7AM contact night coverage as at bottom of note 06/15/2019, 9:00 AM  LOS: 4 days   Consultants  . General Surgery  Procedures  partial colectomy with colostomy and small bowel resection on 9/14  Antibiotics  . Ertapenem, 9/14  Interval History/Subjective  Feels abdominal pain is well controlled on current regimen  Objective   Vitals:  Vitals:   06/14/19 2104 06/15/19 0543  BP: 121/65 138/85  Pulse: 78 91  Resp: 16 16  Temp: 98.1 F (36.7 C) 98.5 F (36.9 C)  SpO2: 98% 100%    Exam:  Awake Alert, Oriented X 3, No new F.N deficits, Normal affect [place].AT Symmetrical Chest wall movement, Good air movement bilaterally, CTAB RRR,No Gallops,Rubs or new Murmurs Minimal bowel sounds, appropriately tender with palpation, staples in place at incision site, colostomy bag in place with no current drainage No Cyanosis, Clubbing or edema, No new Rash or bruise    I have personally reviewed the following:   Data Reviewed: Basic Metabolic Panel: Recent Labs  Lab 06/11/19 0613 06/12/19 0324 06/13/19 0350 06/14/19 0251 06/15/19 0418  NA 134* 138 139 135 131*  K 3.1* 3.5 3.9 4.2 3.6  CL 96* 100 104 101 100  CO2 20* 25 28 28 24   GLUCOSE 146* 137* 121* 181* 115*  BUN 7* <5* <5* <5* 8  CREATININE 0.44 0.47 0.43* 0.33* 0.33*  CALCIUM 8.2* 8.5* 7.9* 7.9* 7.8*  MG 1.8 1.7 2.2 1.8 1.9  PHOS 2.7 2.0* 3.2 2.6 2.3*   Liver Function Tests: Recent Labs  Lab 06/11/19 0613 06/12/19 0324 06/13/19  0350 06/14/19 0251 06/15/19 0418  AST 19 19 18  14* 15  ALT 19 17 13 11 11   ALKPHOS 123 112 94 75 72  BILITOT 1.0 0.4 0.4 0.4 0.3  PROT 6.0* 5.9* 5.1* 5.5* 4.7*  ALBUMIN 2.7* 2.7* 2.3* 2.6* 2.1*   Recent Labs  Lab 06/10/19 1248  LIPASE 22   No results for input(s): AMMONIA in the last 168 hours. CBC: Recent Labs  Lab 06/11/19 0613 06/12/19 0324 06/13/19 0350 06/14/19 0251 06/15/19 0418  WBC 11.5* 10.7* 9.2 11.2* 11.5*  NEUTROABS  --  7.5 6.2 9.1* 9.3*  HGB 12.0 12.3 10.7* 11.5* 10.5*  HCT 36.6 38.9 33.6* 36.3 33.0*  MCV 83.8 84.6 84.4 85.0 85.9  PLT 361 414* 320 313 316   Cardiac Enzymes: No results for input(s): CKTOTAL, CKMB, CKMBINDEX, TROPONINI in the last 168 hours. BNP (last 3 results) No results for input(s): BNP in the last 8760 hours.  ProBNP (last 3 results) No results for input(s): PROBNP in the last 8760 hours.  CBG: Recent Labs  Lab 06/13/19 0400 06/14/19 2320 06/15/19 0629  GLUCAP 109* 121* 113*    Recent Results (from the past 240 hour(s))  SARS Coronavirus 2 [facility] order, Performed in [facility] hospital lab) Nasopharyngeal Nasopharyngeal Swab     Status: None   Collection Time: 06/11/19  1:02 AM   Specimen: Nasopharyngeal Swab  Result Value Ref Range Status   SARS Coronavirus 2 NEGATIVE NEGATIVE Final    Comment: (NOTE) If result is NEGATIVE SARS-CoV-2 target nucleic acids are NOT DETECTED. The SARS-CoV-2 RNA is generally detectable in upper and lower  respiratory specimens during the acute phase of infection. The lowest  concentration of SARS-CoV-2 viral copies this assay can detect is 250  copies / mL. A negative result does not preclude SARS-CoV-2 infection  and should not be used as the sole basis for treatment or other  patient management decisions.  A negative result may occur with  improper specimen collection / handling, submission of specimen other  than nasopharyngeal swab, presence of viral mutation(s) within the  areas  targeted by this assay, and inadequate number of viral copies  (<250 copies / mL). A negative result must be combined with clinical  observations, patient history, and epidemiological information. If result is POSITIVE SARS-CoV-2 target nucleic acids are DETECTED. The SARS-CoV-2 RNA is generally detectable in upper and lower  respiratory specimens dur ing the acute phase of infection.  Positive  results are indicative of active infection with SARS-CoV-2.  Clinical  correlation with patient history and other diagnostic information is  necessary to determine patient infection status.  Positive results do  not rule out bacterial infection or co-infection with other viruses. If result is PRESUMPTIVE POSTIVE SARS-CoV-2 nucleic acids MAY BE PRESENT.   A presumptive positive result was obtained on the submitted specimen  and confirmed on repeat testing.  While 2019 novel coronavirus  (SARS-CoV-2) nucleic acids may be present in the submitted sample  additional confirmatory testing may be necessary for epidemiological  and / or clinical management purposes  to differentiate between  SARS-CoV-2 and other Sarbecovirus currently known to infect humans.  If clinically indicated additional testing with an alternate test  methodology [phone]) is advised. The SARS-CoV-2 RNA is  generally  detectable in upper and lower respiratory sp ecimens during the acute  phase of infection. The expected result is Negative. Fact Sheet for Patients:  StrictlyIdeas.no Fact Sheet for Healthcare Providers: BankingDealers.co.za This test is not yet approved or cleared by the [place] FDA and has been authorized for detection and/or diagnosis of SARS-CoV-2 by FDA under an Emergency Use Authorization (EUA).  This EUA will remain in effect (meaning this test can be used) for the duration of the COVID-19 declaration under Section 564(b)(1) of the Act, 21 U.S.C. section  360bbb-3(b)(1), unless the authorization is terminated or revoked sooner. Performed at [facility], [place] [address] [place], [place] [address]      Studies: No results found.  Scheduled Meds: . Chlorhexidine Gluconate Cloth  6 each Topical Daily  . enoxaparin (LOVENOX) injection  40 mg Subcutaneous Q24H  . insulin aspart  0-9 Units Subcutaneous Q6H  . sodium chloride flush  10-40 mL Intracatheter Q12H  . sodium chloride flush  3 mL Intravenous Once   Continuous Infusions: . sodium chloride 60 mL/hr at 06/15/19 0302  . TPN ADULT (ION) 40 mL/hr at 06/15/19 0200    Principal Problem:   SBO (small bowel obstruction) (HCC) Active Problems:   Hypokalemia   Weight loss   Suspected Colon cancer metastasized to liver ([place])      Desiree Hane  [facility]

## 2019-06-15 NOTE — Progress Notes (Signed)
[facility] NOTE   Pharmacy Consult for TPN Indication: anticipated prolonged PO intolerance  Patient Measurements: Height: 5\' 3"  (160 cm) Weight: 108 lb (49 kg) IBW/kg (Calculated) : 52.4 TPN AdjBW (KG): 49 Body mass index is 19.13 kg/m. Usual Weight: 58 kg  HPI: 27 yoF with PMH renal disease presents 9/11 with n/v and abdominal pain along with 20 lbs weight loss. Has not seen MD in > 20 yrs. CT reveals SBO d/t transverse colon mass; also noted to have omental and liver metastases. On 9/14 underwent ex lap with resection of ileum, partial colectomy, and ascending colostomy. Pharmacy consulted to start TPN while patient remains NPO  Central access: 2x lumen PICC placed 9/14 TPN start date: 9/15  Significant events:  - 9/14: to OR for primary procedure (see HPI)  ASSESSMENT                                                                                                           Today, 06/15/2019:  Glucose (CBG goal 100-150) - well controlled on initial-rate TPN (range 113-121)  1 unit SSI yesterday  No Hx DM  Electrolytes - Na, K, Phos borderline low; others stable WNL  Renal - SCr and BUN previously normal, now both low; bicarb improved to WNL; good UOP  I/O - NG output up from yesterday  LFTs - WNL except albumin low/stable (9/16)  TGs - WNL (9/16)  Prealbumin - undetectable (9/16)  Current Nutrition - NPO  IVF - NS at 60 ml/hr  NUTRITIONAL GOALS                                                                                             RD recs (updated 9/16 d/t cancer and recent surgery): Kcal:  1900-2100 kcal Protein:  95-105 grams Fluid:  >/= 1.8 L/day   PLAN                                                                                                                          KPhos 15 mmol IV x 1  At 1800 today:  Increase TPN to 80 ml/hr as not advancing  diet today  TPN at goal rate of 80 ml/hr provides 96 g  of protein and 1940 kcals, meeting 100% of patient needs  TPN to contain MVI and trace elements daily  Electrolytes: no changes with doubling of TPN rate; Cl:Ac 1:1  Reduce IVF to 20 ml/hr  Continue sensitive SSI with q6 hr CBG checks  TPN lab panels on Mondays & Thursdays  Reuel Boom, PharmD, BCPS [phone] 06/15/2019, 10:40 AM

## 2019-06-15 NOTE — Consult Note (Signed)
[facility] Nurse ostomy consult note Stoma type/location: RMQ Ascending colostomy Stomal assessment/size: 1 1/2" pink and patent. No stool in pouch.  NG tube in place, ileus.  Peristomal assessment: intact midline staple line honeycomb dressing was beneath ostomy pouch and had to be removed.  Staples intact. No new drainage noted.  Treatment options for stomal/peristomal skin: barrier ring and 2 piece pouch Output thick brown stool over os.  I removed as much as I could sweep. Ostomy pouching: 2pc. 2 1/4" pouch with barrier ring  Education provided: Patient observes pouch change.  Discussed twice weekly pouch changes. Activity level and showering.  Patient able to cut barrier and open/roll closed pouch.  Discussed emptying when 1/3 full.  NG tube in place and patient encouraged to mobilize as much as possible.  Enrolled patient in [place] program: No WOC team will follow.  Domenic Moras MSN, RN, FNP-BC CWON Wound, Ostomy, Continence Nurse Pager [phone]

## 2019-06-16 DIAGNOSIS — E43 Unspecified severe protein-calorie malnutrition: Secondary | ICD-10-CM

## 2019-06-16 DIAGNOSIS — C189 Malignant neoplasm of colon, unspecified: Secondary | ICD-10-CM

## 2019-06-16 DIAGNOSIS — C787 Secondary malignant neoplasm of liver and intrahepatic bile duct: Secondary | ICD-10-CM

## 2019-06-16 DIAGNOSIS — K56609 Unspecified intestinal obstruction, unspecified as to partial versus complete obstruction: Secondary | ICD-10-CM

## 2019-06-16 LAB — COMPREHENSIVE METABOLIC PANEL
ALT: 11 U/L (ref 0–44)
AST: 14 U/L — ABNORMAL LOW (ref 15–41)
Albumin: 2.3 g/dL — ABNORMAL LOW (ref 3.5–5.0)
Alkaline Phosphatase: 83 U/L (ref 38–126)
Anion gap: 6 (ref 5–15)
BUN: 9 mg/dL (ref 8–23)
CO2: 28 mmol/L (ref 22–32)
Calcium: 7.6 mg/dL — ABNORMAL LOW (ref 8.9–10.3)
Chloride: 98 mmol/L (ref 98–111)
Creatinine, Ser: 0.33 mg/dL — ABNORMAL LOW (ref 0.44–1.00)
GFR calc Af Amer: >60 mL/min (ref >60)
GFR calc non Af Amer: >60 mL/min (ref >60)
Glucose, Bld: 135 mg/dL — ABNORMAL HIGH (ref 70–99)
Potassium: 4 mmol/L (ref 3.5–5.1)
Sodium: 132 mmol/L — ABNORMAL LOW (ref 135–145)
Total Bilirubin: 0.4 mg/dL (ref 0.3–1.2)
Total Protein: 5.3 g/dL — ABNORMAL LOW (ref 6.5–8.1)

## 2019-06-16 LAB — PHOSPHORUS: Phosphorus: 2.6 mg/dL (ref 2.5–4.6)

## 2019-06-16 LAB — MAGNESIUM: Magnesium: 2.3 mg/dL (ref 1.7–2.4)

## 2019-06-16 LAB — GLUCOSE, CAPILLARY
Glucose-Capillary: 147 mg/dL — ABNORMAL HIGH (ref 70–99)
Glucose-Capillary: 132 mg/dL — ABNORMAL HIGH (ref 70–99)
Glucose-Capillary: 139 mg/dL — ABNORMAL HIGH (ref 70–99)

## 2019-06-16 MED ORDER — INSULIN ASPART 100 UNIT/ML ~~LOC~~ SOLN
0.0000 [IU] | Freq: Four times a day (QID) | SUBCUTANEOUS | Status: DC
  Administered 2019-06-16 – 2019-06-17 (×4): 2 [IU] via SUBCUTANEOUS

## 2019-06-16 MED ORDER — TRAVASOL 10 % IV SOLN
INTRAVENOUS | Status: AC
  Administered 2019-06-16: 17:00:00 via INTRAVENOUS
  Filled 2019-06-16: qty 960

## 2019-06-16 NOTE — Progress Notes (Signed)
Patient ambulated in the hall, 81ft, with front wheel walker; pt tolerated with no signs of distress or dizziness, stated pain is tolerable 4/10, doesn't need pain meds at this time. Will cont to monitor.

## 2019-06-16 NOTE — Progress Notes (Signed)
Assessment & Plan: POD#3 - status post ex lap, partial colectomy with colostomy, small bowel resection             Operative findings - carcinomatosis, liver metastasis, malignant small bowel obstruction, large primary tumor in proximal transverse colon             Discontinue NG tube, allow sips clear liquids today             OOB, ambulation in halls today encouraged, SCD's, IS use             Oncology consultation when path available - hopefully later today  Appreciate WOC nurse consultation and assistance        [name], MD       [facility]       Office: [phone]   Chief Complaint: Metastatic colon carcinoma  Subjective: Patient in bed, comfortable, wants NG out.  Some output from stoma.  Objective: Vital signs in last 24 hours: Temp:  [98.2 F (36.8 C)-98.5 F (36.9 C)] 98.3 F (36.8 C) (09/17 0606) Pulse Rate:  [89-98] 90 (09/17 0606) Resp:  [16] 16 (09/17 0606) BP: (133-138)/(74-77) 137/77 (09/17 0606) SpO2:  [95 %-97 %] 97 % (09/17 0606) Weight:  [51.8 kg-56.7 kg] 51.8 kg (09/16 2100) Last BM Date: 06/05/19  Intake/Output from previous day: 09/16 0701 - 09/17 0700 In: 3052.2 [P.O.:30; I.V.:2387.3; IV Piggyback:634.9] Out: [id number] [Urine:2375; Emesis/NG output:1100] Intake/Output this shift: Total I/O In: -  Out: 300 [Urine:300]  Physical Exam: HEENT - sclerae clear, mucous membranes moist Neck - soft Chest - clear bilaterally Cor - RRR Abdomen - soft, mild distension; stoma viable with small soft stool in bag; midline dressing dry and intact Ext - no edema, non-tender Neuro - alert & oriented, no focal deficits  Lab Results:  Recent Labs    06/14/19 0251 06/15/19 0418  WBC 11.2* 11.5*  HGB 11.5* 10.5*  HCT 36.3 33.0*  PLT 313 316   BMET Recent Labs    06/15/19 0418 06/16/19 0414  NA 131* 132*  K 3.6 4.0  CL 100 98  CO2 24 28  GLUCOSE 115* 135*  BUN 8 9  CREATININE 0.33* 0.33*  CALCIUM 7.8* 7.6*   PT/INR  No results for input(s): LABPROT, INR in the last 72 hours. Comprehensive Metabolic Panel:    Component Value Date/Time   NA 132 (L) 06/16/2019 0414   NA 131 (L) 06/15/2019 0418   K 4.0 06/16/2019 0414   K 3.6 06/15/2019 0418   CL 98 06/16/2019 0414   CL 100 06/15/2019 0418   CO2 28 06/16/2019 0414   CO2 24 06/15/2019 0418   BUN 9 06/16/2019 0414   BUN 8 06/15/2019 0418   CREATININE 0.33 (L) 06/16/2019 0414   CREATININE 0.33 (L) 06/15/2019 0418   GLUCOSE 135 (H) 06/16/2019 0414   GLUCOSE 115 (H) 06/15/2019 0418   CALCIUM 7.6 (L) 06/16/2019 0414   CALCIUM 7.8 (L) 06/15/2019 0418   AST 14 (L) 06/16/2019 0414   AST 15 06/15/2019 0418   ALT 11 06/16/2019 0414   ALT 11 06/15/2019 0418   ALKPHOS 83 06/16/2019 0414   ALKPHOS 72 06/15/2019 0418   BILITOT 0.4 06/16/2019 0414   BILITOT 0.3 06/15/2019 0418   PROT 5.3 (L) 06/16/2019 0414   PROT 4.7 (L) 06/15/2019 0418   ALBUMIN 2.3 (L) 06/16/2019 0414   ALBUMIN 2.1 (L) 06/15/2019 0418    Studies/Results: No results found.  [name] 06/16/2019  Patient ID: Ellen Larson, female   DOB: 01/09/1956, 63 y.o.   MRN: [id number]

## 2019-06-16 NOTE — Progress Notes (Signed)
[place] NOTE   Pharmacy Consult for TPN Indication: anticipated prolonged PO intolerance  Patient Measurements: Height: 5\' 3"  (160 cm) Weight: 114 lb 3.2 oz (51.8 kg)(standing scale) IBW/kg (Calculated) : 52.4 TPN AdjBW (KG): 49 Body mass index is 20.23 kg/m. Usual Weight: 58 kg  HPI: 2 yoF with PMH renal disease presents 9/11 with n/v and abdominal pain along with 20 lbs weight loss. Has not seen MD in > 20 yrs. CT reveals SBO d/t transverse colon mass; also noted to have omental and liver metastases. On 9/14 underwent ex lap with resection of ileum, partial colectomy, and ascending colostomy. Pharmacy consulted to start TPN while patient remains NPO  Central access: 2x lumen PICC placed 9/14 TPN start date: 9/15  Significant events:  - 9/14: to OR for primary procedure (see HPI) - 9/17: advancing to clears, remove NGT  ASSESSMENT                                                                                                           Today, 06/16/2019:  Glucose (CBG goal 100-150) - mostly controlled on goal-rate TPN (range 144-163)  4 unit SSI since advancing TPN  No Hx DM  Electrolytes - Na remains low despite increasing TPN rate, K and Phos improved to WNL after repletion yesterday; Ca, Cl slightly decreased  Renal - SCr remains low; BUN and bicarb WNL but rising; good UOP  I/O - NG output much higher yesterday NGT removed today with trial of CLD  LFTs - WNL except albumin low/stable (9/17)  TGs - WNL (9/16)  Prealbumin - undetectable (9/16)  Current Nutrition - CLD  IVF - NS at 20 ml/hr  NUTRITIONAL GOALS                                                                                             RD recs (updated 9/16 d/t cancer and recent surgery): Kcal:  1900-2100 kcal Protein:  95-105 grams Fluid:  >/= 1.8 L/day   PLAN  At 1800 today:  Continue TPN at 80 ml/hr  TPN at goal rate of 80 ml/hr provides 96 g of protein and 1940 kcals, meeting 100% of patient needs  TPN to contain MVI and trace elements daily  Electrolytes: increase Na, Ca, reduce Mag; Cl:Ac = max Cl  Continue IVF at 20 ml/hr  Advance SSI to moderate scale with q6 hr CBG checks - should be able to reduce frequency once CBGs better controlled  TPN lab panels on Mondays & Thursdays  Bmet, Mag, Phos tomorrow  Reuel Boom, PharmD, BCPS [phone] 06/16/2019, 10:56 AM

## 2019-06-16 NOTE — Progress Notes (Signed)
[facility]  PROGRESS NOTE  Ellen Larson [id number] DOB: 1956/09/07 DOA: 06/10/2019 PCP: Patient, No Pcp Per  Brief History    Ellen Larson is a 63 y.o. year old female with no significant medical history  who presented on 06/10/2019 with nausea, vomiting, abdominal pain and unexplained significant weight loss over several weeks.  She presented to ED after outpatient evaluation for umbilical hernia based on her symptoms was sent to the ED, patient was found to have colonic wall thickening highly suspicious for primary colonic malignancy and concern for mesenteric implant causing small bowel obstruction.  Patient underwent exploratory laparotomy with small bowel resection and primary anastomosis/partial colectomy with colostomy placed on 9/14  A & P     Malignant small bowel obstruction, due to persistent SBO underwent partial colectomy with colostomy and small bowel resection on 9/14.  Given recent abdominal surgery patient started on TPN on 9/15 for nutritional support until bowel function returns.  NG tube removed, started on clear liquids per surgery consultants, continue IV fluid hydration,.  Judicious use of pain control as patient became very somnolent with PCA pump earlier in hospitalization, currently on IV morphine will closely monitor respiratory status/mental status ,   Tylenol and Robaxin for pain control.   Transverse colon carcinoma with omental metastasis, peritoneal carcinomatosis, liver metastasis.  Operative findings with large primary tumor in proximal transverse colon.  Pathology pending, consult oncology once available   Hypokalemia, resolved.  Received repletion IV, closely monitor while on TPN   Hypophosphatemia resolved.  Received repletion IV, closely monitor while on TPN   Unintentional weight loss, likely secondary to malignancy.  Prealbumin less than 5, TPN for nutritional support, dietary consulted   Normocytic anemia hemoglobin  stable .  likely related to suspected GI malignancy.  Closely monitor, no active signs or symptoms of bleeding.     DVT prophylaxis: lovenox Code Status: Full Family Communication: No family at bedside Disposition Plan: Needs continued inpatient stay given need for TPN while awaiting bowel function return, close monitoring of electrolytes/nutrients requiring IV repletion and IV pain control     [facility] Direct contact: see [email or web address] (further directions at bottom of note if needed) 7PM-7AM contact night coverage as at bottom of note 06/16/2019, 4:11 PM  LOS: 5 days   Consultants  . General Surgery  Procedures  partial colectomy with colostomy and small bowel resection on 9/14  Antibiotics  . Ertapenem, 9/14  Interval History/Subjective  Feels abdominal pain is well controlled on current regimen Excited about some colostomy output Tolerating liquids so far  Objective   Vitals:  Vitals:   06/16/19 0606 06/16/19 1412  BP: 137/77 (!) 147/81  Pulse: 90 95  Resp: 16 16  Temp: 98.3 F (36.8 C) 98.9 F (37.2 C)  SpO2: 97% 98%    Exam:  Awake Alert, Oriented X 3, No new F.N deficits, Normal affect [place].AT Symmetrical Chest wall movement, Good air movement bilaterally, CTAB RRR,No Gallops,Rubs or new Murmurs Minimal bowel sounds, appropriately tender with palpation, dressings in place at incision site of central abdomen, colostomy bag in place with no liquid output  No Cyanosis, Clubbing or edema, No new Rash or bruise    I have personally reviewed the following:   Data Reviewed: Basic Metabolic Panel: Recent Labs  Lab 06/12/19 0324 06/13/19 0350 06/14/19 0251 06/15/19 0418 06/16/19 0414  NA 138 139 135 131* 132*  K 3.5 3.9 4.2 3.6 4.0  CL 100 104 101 100 98  CO2 25 28 28  24 28  GLUCOSE 137* 121* 181* 115* 135*  BUN <5* <5* <5* 8 9  CREATININE 0.47 0.43* 0.33* 0.33* 0.33*  CALCIUM 8.5* 7.9* 7.9* 7.8* 7.6*  MG 1.7 2.2 1.8 1.9 2.3  PHOS 2.0* 3.2 2.6 2.3* 2.6    Liver Function Tests: Recent Labs  Lab 06/12/19 0324 06/13/19 0350 06/14/19 0251 06/15/19 0418 06/16/19 0414  AST 19 18 14* 15 14*  ALT 17 13 11 11 11   ALKPHOS 112 94 75 72 83  BILITOT 0.4 0.4 0.4 0.3 0.4  PROT 5.9* 5.1* 5.5* 4.7* 5.3*  ALBUMIN 2.7* 2.3* 2.6* 2.1* 2.3*   Recent Labs  Lab 06/10/19 1248  LIPASE 22   No results for input(s): AMMONIA in the last 168 hours. CBC: Recent Labs  Lab 06/11/19 0613 06/12/19 0324 06/13/19 0350 06/14/19 0251 06/15/19 0418  WBC 11.5* 10.7* 9.2 11.2* 11.5*  NEUTROABS  --  7.5 6.2 9.1* 9.3*  HGB 12.0 12.3 10.7* 11.5* 10.5*  HCT 36.6 38.9 33.6* 36.3 33.0*  MCV 83.8 84.6 84.4 85.0 85.9  PLT 361 414* 320 313 316   Cardiac Enzymes: No results for input(s): CKTOTAL, CKMB, CKMBINDEX, TROPONINI in the last 168 hours. BNP (last 3 results) No results for input(s): BNP in the last 8760 hours.  ProBNP (last 3 results) No results for input(s): PROBNP in the last 8760 hours.  CBG: Recent Labs  Lab 06/15/19 1227 06/15/19 1813 06/15/19 2328 06/16/19 0607 06/16/19 1152  GLUCAP 116* 163* 144* 147* 132*    Recent Results (from the past 240 hour(s))  SARS Coronavirus 2 [facility] order, Performed in [facility] hospital lab) Nasopharyngeal Nasopharyngeal Swab     Status: None   Collection Time: 06/11/19  1:02 AM   Specimen: Nasopharyngeal Swab  Result Value Ref Range Status   SARS Coronavirus 2 NEGATIVE NEGATIVE Final    Comment: (NOTE) If result is NEGATIVE SARS-CoV-2 target nucleic acids are NOT DETECTED. The SARS-CoV-2 RNA is generally detectable in upper and lower  respiratory specimens during the acute phase of infection. The lowest  concentration of SARS-CoV-2 viral copies this assay can detect is 250  copies / mL. A negative result does not preclude SARS-CoV-2 infection  and should not be used as the sole basis for treatment or other  patient management decisions.  A negative result may occur with  improper specimen  collection / handling, submission of specimen other  than nasopharyngeal swab, presence of viral mutation(s) within the  areas targeted by this assay, and inadequate number of viral copies  (<250 copies / mL). A negative result must be combined with clinical  observations, patient history, and epidemiological information. If result is POSITIVE SARS-CoV-2 target nucleic acids are DETECTED. The SARS-CoV-2 RNA is generally detectable in upper and lower  respiratory specimens dur ing the acute phase of infection.  Positive  results are indicative of active infection with SARS-CoV-2.  Clinical  correlation with patient history and other diagnostic information is  necessary to determine patient infection status.  Positive results do  not rule out bacterial infection or co-infection with other viruses. If result is PRESUMPTIVE POSTIVE SARS-CoV-2 nucleic acids MAY BE PRESENT.   A presumptive positive result was obtained on the submitted specimen  and confirmed on repeat testing.  While 2019 novel coronavirus  (SARS-CoV-2) nucleic acids may be present in the submitted sample  additional confirmatory testing may be necessary for epidemiological  and / or clinical management purposes  to differentiate between  SARS-CoV-2 and other Sarbecovirus currently  known to infect humans.  If clinically indicated additional testing with an alternate test  methodology [phone]) is advised. The SARS-CoV-2 RNA is generally  detectable in upper and lower respiratory sp ecimens during the acute  phase of infection. The expected result is Negative. Fact Sheet for Patients:  StrictlyIdeas.no Fact Sheet for Healthcare Providers: BankingDealers.co.za This test is not yet approved or cleared by the [place] FDA and has been authorized for detection and/or diagnosis of SARS-CoV-2 by FDA under an Emergency Use Authorization (EUA).  This EUA will remain in effect  (meaning this test can be used) for the duration of the COVID-19 declaration under Section 564(b)(1) of the Act, 21 U.S.C. section 360bbb-3(b)(1), unless the authorization is terminated or revoked sooner. Performed at [facility], [place] [address], [place], [place] [address]      Studies: No results found.  Scheduled Meds: . Chlorhexidine Gluconate Cloth  6 each Topical Daily  . enoxaparin (LOVENOX) injection  40 mg Subcutaneous Q24H  . insulin aspart  0-15 Units Subcutaneous Q6H  . sodium chloride flush  10-40 mL Intracatheter Q12H  . sodium chloride flush  3 mL Intravenous Once   Continuous Infusions: . sodium chloride 20 mL/hr at 06/16/19 1116  . methocarbamol (ROBAXIN) IV 500 mg (06/16/19 1534)  . TPN ADULT (ION) 80 mL/hr at 06/16/19 1000  . TPN ADULT (ION)      Principal Problem:   SBO (small bowel obstruction) (HCC) Active Problems:   Hypokalemia   Weight loss   Suspected Colon cancer metastasized to liver ([place])   Hypophosphatemia   Normocytic anemia      Waldron Session Nettey  [facility]

## 2019-06-16 NOTE — Consult Note (Signed)
Marland Kitchen    HEMATOLOGY/ONCOLOGY CONSULTATION NOTE  Date of Service: 06/16/2019  Patient Care Team: Patient, No Pcp Per as PCP - General (General Practice)  CHIEF COMPLAINTS/PURPOSE OF CONSULTATION:  Newly diagnosed metastatic colon cancer  HISTORY OF PRESENTING ILLNESS:   Ellen Larson is a wonderful 63 y.o. female who has been referred to [place] for evaluation and management of her Metastatic colon carcinoma.   Patient is a previous healthy female with no significant PMHx who had not seen a physician in 20 yrs presented with anorexia, nausea and vomiting and 20lbs weight loss and was noted to have a small bowel obstruction  Patient had a CT with abdomen and pelvis completed on 06/11/2019 with results revealing "Acute finding of small-bowel obstruction with transition point in the left lower quadrant. More ominous finding of irregular 4.4 cm low-density colonic wall thickening in the proximal transverse colon, highly suspicious for primary colonic malignancy. Adjacent omental stranding and small volume abdominopelvic ascites, consistent with omental caking. Small volume abdominopelvic ascites. Small bowel obstruction may be due to mesenteric implant in this setting. Recommend oncologic workup and colonoscopy for further evaluation of potential colonic lesions. Multiple hypodense liver lesions, many of which are ill-defined, suspicious for metastatic disease. Small enhancing foci in the pelvis anterior to the uterine fundus and posterior with cervix may represent pelvic soft tissue deposits or less likely fibroids are also considered."  Pt had abdomen xray completed on 06/12/2019 with results revealing "Some gas and stool are noted in the colon and distal rectum. However, there continue to be multiple dilated loops of small bowel in the central abdomen measuring up to 5.1 cm in diameter, compatible with persistent partial small bowel obstruction. No gross evidence of pneumoperitoneum on this single  supine view."  She then underwent an exploratory laparotomy partial colectomy with ascending colostomy and liver biopsy on 06/13/2019 under Dr. [name]. Biopsy results pending. Operative findings include: carcinomatosis, liver metastases, malignant small bowel obstruction, large primary tumor in proximal transverse colon.  Most recent lab results (06/15/2019) of CBC is as follows: all values are WNL except for WBC at 11.5, RBC at 3.84, HGB at 10.5, HCT at 33.0, and Neutro Abs at 9.3.  Surgical pathology is not available at this time  On review of systems, pt reports 20 lbs weight loss, progressive anorexia and worsening fatigue prior to admission. Her nutritional status was very poor with prealbumin levels <5 and is currently on TPN post -operatively. Has been started on clear liquid diet.  MEDICAL HISTORY:  Past Medical History:  Diagnosis Date   Renal disorder     SURGICAL HISTORY: Past Surgical History:  Procedure Laterality Date   BOWEL RESECTION N/A 06/13/2019   Procedure: SMALL BOWEL RESECTION;  Surgeon: [name], MD;  Location: [facility];  Service: General;  Laterality: N/A;   CERVICAL ABLATION     kidney stone removal     PARTIAL COLECTOMY N/A 06/13/2019   Procedure: exploratory laparotomy partial colectomy with ascendingcolostomy;  Surgeon: [name], MD;  Location: [facility];  Service: General;  Laterality: N/A;    SOCIAL HISTORY: Social History   Socioeconomic History   Marital status: Single    Spouse name: Not on file   Number of children: Not on file   Years of education: Not on file   Highest education level: Not on file  Occupational History   Not on file  Social Needs   Financial resource strain: Not on file   Food insecurity    Worry:  Not on file    Inability: Not on file   Transportation needs    Medical: Not on file    Non-medical: Not on file  Tobacco Use   Smoking status: Former Smoker   Smokeless tobacco: Never Used    Substance and Sexual Activity   Alcohol use: Yes   Drug use: Not Currently   Sexual activity: Not on file  Lifestyle   Physical activity    Days per week: Not on file    Minutes per session: Not on file   Stress: Not on file  Relationships   Social connections    Talks on phone: Not on file    Gets together: Not on file    Attends religious service: Not on file    Active member of club or organization: Not on file    Attends meetings of clubs or organizations: Not on file    Relationship status: Not on file   Intimate partner violence    Fear of current or ex partner: Not on file    Emotionally abused: Not on file    Physically abused: Not on file    Forced sexual activity: Not on file  Other Topics Concern   Not on file  Social History Narrative   Not on file    FAMILY HISTORY: Family History  Problem Relation Age of Onset   Heart failure Mother    Kidney failure Mother    Cancer Mother     ALLERGIES:  has No Known Allergies.  MEDICATIONS:  Current Facility-Administered Medications  Medication Dose Route Frequency Provider Last Rate Last Dose   0.9 %  sodium chloride infusion   Intravenous Continuous Polly Cobia, RPH 20 mL/hr at 06/16/19 1116     acetaminophen (TYLENOL) tablet 650 mg  650 mg Oral Q6H PRN [name], MD       Or   acetaminophen (TYLENOL) suppository 650 mg  650 mg Rectal Q6H PRN [name], MD       Chlorhexidine Gluconate Cloth 2 % PADS 6 each  6 each Topical Daily Raiford Noble [place], DO   6 each at 06/16/19 1023   diphenhydrAMINE (BENADRYL) injection 12.5 mg  12.5 mg Intravenous Q6H PRN [name], MD       Or   diphenhydrAMINE (BENADRYL) 12.5 MG/5ML elixir 12.5 mg  12.5 mg Oral Q6H PRN [name], MD       enoxaparin (LOVENOX) injection 40 mg  40 mg Subcutaneous Q24H [name], MD   40 mg at 06/16/19 0824   insulin aspart (novoLOG) injection 0-15 Units  0-15 Units Subcutaneous Q6H Polly Cobia, RPH   2  Units at 06/16/19 1211   methocarbamol (ROBAXIN) 500 mg in dextrose 5 % 50 mL IVPB  500 mg Intravenous Q6H Meuth, Brooke A, PA-C 100 mL/hr at 06/16/19 1534 500 mg at 06/16/19 1534   morphine 2 MG/ML injection 1-2 mg  1-2 mg Intravenous Q1H PRN [name], MD   2 mg at 06/16/19 1709   naloxone (NARCAN) injection 0.4 mg  0.4 mg Intravenous PRN [name], MD       And   sodium chloride flush (NS) 0.9 % injection 9 mL  9 mL Intravenous PRN [name], MD       ondansetron (ZOFRAN-ODT) disintegrating tablet 4 mg  4 mg Oral Q6H PRN [name], MD       Or   ondansetron (ZOFRAN) injection 4 mg  4 mg Intravenous Q6H PRN Gerkin,  Sherren Mocha, MD   4 mg at 06/16/19 0942   prochlorperazine (COMPAZINE) injection 10 mg  10 mg Intravenous Q6H PRN Raiford Noble Latif, DO   10 mg at 06/14/19 1443   sodium chloride flush (NS) 0.9 % injection 10-40 mL  10-40 mL Intracatheter Q12H Sheikh, [place], [place]   Stopped at 06/14/19 1046   sodium chloride flush (NS) 0.9 % injection 10-40 mL  10-40 mL Intracatheter PRN Sheikh, Omair Latif, DO       sodium chloride flush (NS) 0.9 % injection 3 mL  3 mL Intravenous Once Fredia Sorrow, MD       TPN ADULT (ION)   Intravenous Continuous TPN Polly Cobia, RPH 80 mL/hr at 06/16/19 1000     TPN ADULT (ION)   Intravenous Continuous TPN Polly Cobia, RPH 80 mL/hr at 06/16/19 1707      REVIEW OF SYSTEMS:    10 Point review of Systems was done is negative except as noted above.  PHYSICAL EXAMINATION: ECOG PERFORMANCE STATUS: 1 - Symptomatic but completely ambulatory  . Vitals:   06/16/19 0606 06/16/19 1412  BP: 137/77 (!) 147/81  Pulse: 90 95  Resp: 16 16  Temp: 98.3 F (36.8 C) 98.9 F (37.2 C)  SpO2: 97% 98%   Filed Weights   06/10/19 1208 06/15/19 1825 06/15/19 2100  Weight: 108 lb (49 kg) 125 lb (56.7 kg) 114 lb 3.2 oz (51.8 kg)   .Body mass index is 20.23 kg/m.  GENERAL:alert, in no acute distress and somewha emaciated appearance   SKIN: no acute rashes, no significant lesions EYES: conjunctiva are pink and non-injected, sclera anicteric OROPHARYNX: MMM, no exudates, no oropharyngeal erythema or ulceration NECK: supple, no JVD LYMPH:  no palpable lymphadenopathy in the cervical, axillary or inguinal regions LUNGS: clear to auscultation b/l with normal respiratory effort HEART: regular rate & rhythm ABDOMEN:  normoactive bowel sounds , ostomy in situ. No guarding ,rigidity or rebound. Extremity: no pedal edema PSYCH: alert & oriented x 3 with fluent speech NEURO: no focal motor/sensory deficits  LABORATORY DATA:  I have reviewed the data as listed  . CBC Latest Ref Rng & Units 06/15/2019 06/14/2019 06/13/2019  WBC 4.0 - 10.5 K/uL 11.5(H) 11.2(H) 9.2  Hemoglobin 12.0 - 15.0 g/dL 10.5(L) 11.5(L) 10.7(L)  Hematocrit 36.0 - 46.0 % 33.0(L) 36.3 33.6(L)  Platelets 150 - 400 K/uL 316 313 320    . CMP Latest Ref Rng & Units 06/17/2019 06/16/2019 06/15/2019  Glucose 70 - 99 mg/dL 134(H) 135(H) 115(H)  BUN 8 - 23 mg/dL 10 9 8   Creatinine 0.44 - 1.00 mg/dL <0.30(L) 0.33(L) 0.33(L)  Sodium 135 - 145 mmol/L 131(L) 132(L) 131(L)  Potassium 3.5 - 5.1 mmol/L 4.2 4.0 3.6  Chloride 98 - 111 mmol/L 100 98 100  CO2 22 - 32 mmol/L 23 28 24   Calcium 8.9 - 10.3 mg/dL 7.8(L) 7.6(L) 7.8(L)  Total Protein 6.5 - 8.1 g/dL - 5.3(L) 4.7(L)  Total Bilirubin 0.3 - 1.2 mg/dL - 0.4 0.3  Alkaline Phos 38 - 126 U/L - 83 72  AST 15 - 41 U/L - 14(L) 15  ALT 0 - 44 U/L - 11 11   Component     Latest Ref Rng & Units 06/11/2019 06/15/2019  CEA     0.0 - 4.7 ng/mL 33.6 (H)   PREALBUMIN     18 - 38 mg/dL  <5 (L)    RADIOGRAPHIC STUDIES: I have personally reviewed the radiological images as listed and agreed with  the findings in the report. Dg Abd 1 View  Result Date: 06/12/2019 CLINICAL DATA:  63 year old female with history of abdominal pain. EXAM: ABDOMEN - 1 VIEW COMPARISON:  Abdominal radiograph 06/01/2019. FINDINGS: Some gas and stool  are noted in the colon and distal rectum. However, there continue to be multiple dilated loops of small bowel in the central abdomen measuring up to 5.1 cm in diameter, compatible with persistent partial small bowel obstruction. No gross evidence of pneumoperitoneum on this single supine view. IMPRESSION: 1. Findings are compatible with persistent partial small bowel obstruction. Electronically Signed   By: Vinnie Langton M.D.   On: 06/12/2019 04:52   Ct Abdomen Pelvis W Contrast  Result Date: 06/11/2019 CLINICAL DATA:  Abdominal pain. Nausea, vomiting, diarrhea. Bilious vomiting. EXAM: CT ABDOMEN AND PELVIS WITH CONTRAST TECHNIQUE: Multidetector CT imaging of the abdomen and pelvis was performed using the standard protocol following bolus administration of intravenous contrast. CONTRAST:  158m OMNIPAQUE IOHEXOL 300 MG/ML  SOLN COMPARISON:  None. FINDINGS: Lower chest: Mild hypoventilatory changes at the lung bases. No pleural fluid. Hepatobiliary: Multiple hypodense liver lesions, many of which are ill-defined, suspicious for metastatic disease. Dominant lesion in the subcapsular right lobe measures 4.1 x 2.9 cm, series 2, image 12. Gallbladder is partially distended, no calcified gallstone. No biliary dilatation. Pancreas: No evidence pancreatic mass. No ductal dilatation or inflammation. Spleen: Normal in size without focal abnormality. Adrenals/Urinary Tract: Mild left adrenal thickening without focal nodule. Normal right adrenal gland. No hydronephrosis or perinephric edema. Homogeneous renal enhancement with symmetric excretion on delayed phase imaging. Small subcentimeter hypodensity in the mid left kidney is too small to characterize but likely cyst. Urinary bladder is physiologically distended without wall thickening. Early excreted contrast in both renal collecting systems in the urinary bladder. Stomach/Bowel: Prominently distended stomach. Dilated fluid-filled small bowel with transition point in  the left lower quadrant, series 2, image 53. No obvious small bowel mass or cause of obstruction. More distal small bowel is decompressed. The appendix is normal. Within the proximal transverse colon there is an approximately 4.4 cm segment of irregular low-density colonic wall thickening. Colon distal to this is decompressed and not well evaluated. Mild sigmoid diverticulosis without diverticulitis. Vascular/Lymphatic: Mild aortic atherosclerosis without aneurysm. Portal and mesenteric veins are patent. Suspected prominent pericolonic nodes adjacent irregular transverse wall thickening,, series 2, image 24, however difficult to delineate from adjacent omental stranding. No retroperitoneal adenopathy. No enlarged pelvic lymph nodes. Reproductive: Small enhancing foci anterior to the uterine fundus and posterior to the cervix (series 5, image 59 and series 2, image 72 respectively). Ovaries tentatively visualized and normal. Other: Heterogeneous ill-defined soft tissue density and stranding in the anterior omentum suspicious for omental caking. Small volume abdominopelvic ascites. No free air. Tiny fat containing umbilical hernia. Musculoskeletal: No blastic or destructive lytic lesions. Minor degenerative change in the spine. IMPRESSION: 1. Acute finding of small-bowel obstruction with transition point in the left lower quadrant. 2. More ominous finding of irregular 4.4 cm low-density colonic wall thickening in the proximal transverse colon, highly suspicious for primary colonic malignancy. Adjacent omental stranding and small volume abdominopelvic ascites, consistent with omental caking. Small volume abdominopelvic ascites. Small bowel obstruction may be due to mesenteric implant in this setting. Recommend oncologic workup and colonoscopy for further evaluation of potential colonic lesions. 3. Multiple hypodense liver lesions, many of which are ill-defined, suspicious for metastatic disease. 4. Small enhancing foci  in the pelvis anterior to the uterine fundus and posterior with cervix may  represent pelvic soft tissue deposits or less likely fibroids are also considered. Aortic Atherosclerosis (ICD10-I70.0). Electronically Signed   By: Keith Rake M.D.   On: 06/11/2019 00:53   US Abdomen Limited  Result Date: 06/03/2019 CLINICAL DATA:  Palpable area to the right of the umbilicus EXAM: ULTRASOUND ABDOMEN LIMITED COMPARISON:  None. FINDINGS: There appears to be a small hernia superior to the umbilicus measuring up to 1.4 cm. This appears to contain fat. No visible bowel. No change with Valsalva. No other abnormality noted. IMPRESSION: Small supraumbilical midline ventral hernia containing fat. Electronically Signed   By: Rolm Baptise M.D.   On: 06/03/2019 20:38   [place] Ekg Site Rite  Result Date: 06/11/2019 If Site Rite image not attached, placement could not be confirmed due to current cardiac rhythm.   ASSESSMENT & PLAN:   63 yo female with   1) Newly diagnosed metastatic colon cancer from transverse colon. Presented with Small bowel obstruction s/p partial colectomy, ascending colostomy and liver biopsy. Baseline CEA level 33.6 2) Liver metastases on CT abd 3) Omental metastases 4) Severe protein calorie malnutrition - weight loss of 20 lbs, prealbumin of <5. Currently on TPN  PLAN -discussed all the lab and imaging findings and likely diagnosis with the patient -final surgical pathology pending. -discussed that imaging shows evidence of omental and liver metastases -CT chest ordered to complete staging workup -on TPN to optimize nutritional support to facilitate good surgical healing -discussed treatment goal shall be palliative and not curative due to unresectable metastatic disease -will get foundation one testing, MMR testing on tumor sample. -will setup for outpatient f/u with medical oncology Dr Irene Limbo in 2 weeks to decide on systemic therpaues after she heals from surgery. -patient notes her  daughter lives in [place] and she is deciding whether she shall move there and potential seek further cares at [facility] -- she is currently undecided.   All of the patients questions were answered with apparent satisfaction. The patient knows to call the clinic with any problems, questions or concerns.  I spent 60 minutes counseling the patient face to face. The total time spent in the appointment was 80 minutes and more than 50% was on counseling and direct patient cares.    Sullivan Lone MD [place] AAHIVMS [facility] Hematology/Oncology Physician [facility]  (Office):       [phone] (Work cell):  [phone] (Fax):           [phone]  06/16/2019 5:20 PM

## 2019-06-17 LAB — BASIC METABOLIC PANEL
Anion gap: 8 (ref 5–15)
BUN: 10 mg/dL (ref 8–23)
CO2: 23 mmol/L (ref 22–32)
Calcium: 7.8 mg/dL — ABNORMAL LOW (ref 8.9–10.3)
Chloride: 100 mmol/L (ref 98–111)
Creatinine, Ser: <0.3 mg/dL — ABNORMAL LOW (ref 0.44–1.00)
Glucose, Bld: 134 mg/dL — ABNORMAL HIGH (ref 70–99)
Potassium: 4.2 mmol/L (ref 3.5–5.1)
Sodium: 131 mmol/L — ABNORMAL LOW (ref 135–145)

## 2019-06-17 LAB — GLUCOSE, CAPILLARY
Glucose-Capillary: 126 mg/dL — ABNORMAL HIGH (ref 70–99)
Glucose-Capillary: 135 mg/dL — ABNORMAL HIGH (ref 70–99)
Glucose-Capillary: 140 mg/dL — ABNORMAL HIGH (ref 70–99)

## 2019-06-17 LAB — CEA: CEA: 33.6 ng/mL — ABNORMAL HIGH (ref 0.0–4.7)

## 2019-06-17 LAB — MAGNESIUM: Magnesium: 2.2 mg/dL (ref 1.7–2.4)

## 2019-06-17 LAB — PHOSPHORUS: Phosphorus: 2.5 mg/dL (ref 2.5–4.6)

## 2019-06-17 MED ORDER — INSULIN ASPART 100 UNIT/ML ~~LOC~~ SOLN
0.0000 [IU] | Freq: Three times a day (TID) | SUBCUTANEOUS | Status: AC
  Administered 2019-06-17: 15:00:00 2 [IU] via SUBCUTANEOUS
  Administered 2019-06-17: 3 [IU] via SUBCUTANEOUS
  Administered 2019-06-18 – 2019-06-22 (×7): 2 [IU] via SUBCUTANEOUS

## 2019-06-17 MED ORDER — TRAVASOL 10 % IV SOLN
INTRAVENOUS | Status: AC
  Administered 2019-06-17: 17:00:00 via INTRAVENOUS
  Filled 2019-06-17: qty 960

## 2019-06-17 NOTE — Progress Notes (Signed)
  [facility] Progress Note  4 Days Post-Op  Subjective: CC-  Still sore but abdominal pain is different. Less crampy pain. Some nausea, no emesis. Tolerating few sips of clear liquids. Ostomy with more output.  Objective: Vital signs in last 24 hours: Temp:  [98.6 F (37 C)-99.1 F (37.3 C)] 98.6 F (37 C) (09/18 0552) Pulse Rate:  [95-102] 102 (09/18 0552) Resp:  [16] 16 (09/18 0552) BP: (143-147)/(68-87) 143/68 (09/18 0552) SpO2:  [96 %-98 %] 96 % (09/18 0552) Last BM Date: 06/16/19  Intake/Output from previous day: 09/17 0701 - 09/18 0700 In: 2434.2 [P.O.:180; I.V.:2104.1; IV Piggyback:150.1] Out: 2625 [Urine:2300; Emesis/NG output:75; Stool:250] Intake/Output this shift: No intake/output data recorded.  PE: Gen:  Alert, NAD, pleasant HEENT: EOM's intact, pupils equal and round Pulm:  Rate and effort normal Abd: Soft, appropriately tender, + BS, midline incision cdi with staples intact and no erythema or drainage, ostomy viable with air and soft brown stool in pouch Skin: warm and dry  Lab Results:  Recent Labs    06/15/19 0418  WBC 11.5*  HGB 10.5*  HCT 33.0*  PLT 316   BMET Recent Labs    06/16/19 0414 06/17/19 0356  NA 132* 131*  K 4.0 4.2  CL 98 100  CO2 28 23  GLUCOSE 135* 134*  BUN 9 10  CREATININE 0.33* <0.30*  CALCIUM 7.6* 7.8*   PT/INR No results for input(s): LABPROT, INR in the last 72 hours. CMP     Component Value Date/Time   NA 131 (L) 06/17/2019 0356   K 4.2 06/17/2019 0356   CL 100 06/17/2019 0356   CO2 23 06/17/2019 0356   GLUCOSE 134 (H) 06/17/2019 0356   BUN 10 06/17/2019 0356   CREATININE <0.30 (L) 06/17/2019 0356   CALCIUM 7.8 (L) 06/17/2019 0356   PROT 5.3 (L) 06/16/2019 0414   ALBUMIN 2.3 (L) 06/16/2019 0414   AST 14 (L) 06/16/2019 0414   ALT 11 06/16/2019 0414   ALKPHOS 83 06/16/2019 0414   BILITOT 0.4 06/16/2019 0414   GFRNONAA NOT CALCULATED 06/17/2019 0356   GFRAA NOT CALCULATED 06/17/2019 0356    Lipase     Component Value Date/Time   LIPASE 22 06/10/2019 1248       Studies/Results: No results found.  Anti-infectives: Anti-infectives (From admission, onward)   Start     Dose/Rate Route Frequency Ordered Stop   06/13/19 0933  ertapenem (INVANZ) 1,000 mg in sodium chloride 0.9 % 100 mL IVPB     1 g 200 mL/hr over 30 Minutes Intravenous 30 min pre-op 06/13/19 0930 06/13/19 1042       Assessment/Plan Malnutrition - prealbumin <5 (9/16), continue TPN  Transverse colon carcinoma with omental metastasis, peritoneal carcinomatosis, liver metastasis, malignant small bowel obstruction S/p Exploratory laparotomy, small bowel resection with primary anastomosis, partial colectomy with ascending colostomy 9/14 Dr. Harlow Asa - POD #4 - surgical pathology pending - NG out 9/17  ID - invanz periop. TMAX 99.1 FEN - IVF, TPN, CLD VTE - SCDs, lovenox Foley - d/c 9/16 Follow up - Dr. Harlow Asa  Plan: Continue CLD and await improved bowel function. Continue TPN until able to tolerate adequate diet PO. Mobilize. Appreciate oncology recommendations. Hopefully official surgical pathology will be back later today.   LOS: 6 days    Wellington Hampshire , [facility] Surgery 06/17/2019, 8:48 AM Pager: [phone] Mon-Thurs 7:00 am-4:30 pm Fri 7:00 am -11:30 AM Sat-Sun 7:00 am-11:30 am

## 2019-06-17 NOTE — Consult Note (Signed)
[facility] Nurse ostomy follow up Stoma type/location: RMQ ascending colostomy Stomal assessment/size: 1 1/2" pink well budded patent and producing soft brown stool NG tube out. Ileus resolved Peristomal assessment: intact Treatment options for stomal/peristomal skin: barrier ring and 2 piece pouch aplied.  Output  Ostomy pouching:    2 1/4" 2pc. Pouch with barrier ring Education provided: pouch change performed. Patient cut barrier and applied pouch.  Rolled closed.  Has been emptying.  Enrolled patient in [facility] Start Discharge program: Yes today [facility] team will follow.  Domenic Moras MSN, RN, FNP-BC [facility]ON Wound, Ostomy, Continence Nurse Pager [phone]

## 2019-06-17 NOTE — Progress Notes (Signed)
Physical Therapy Treatment Patient Details Name: Ellen Larson MRN: [id number] DOB: April 14, 1956 Today's Date: 06/17/2019    History of Present Illness 63 y.o. female s/p Exploratory laparotomy, small bowel resection with primary anastomosis, partial colectomy with acsending colostomy    PT Comments    Pt progressing slowly.  Pleasant and motivated.  Getting OOB to [facility] several times a day and amb in hallway.     Follow Up Recommendations  Home health PT;Supervision for mobility/OOB;SNF     Equipment Recommendations  None recommended by PT    Recommendations for Other Services       Precautions / Restrictions      Mobility  Bed Mobility Overal bed mobility: Needs Assistance Bed Mobility: Sit to Supine       Sit to supine: Supervision   General bed mobility comments: on BSC on arrival, MinGuard Assist B LE back onto bed (improving)  Transfers Overall transfer level: Needs assistance Equipment used: 1 person hand held assist Transfers: Sit to/from Stand Sit to Stand: Min guard         General transfer comment: good safety cognition and use of hands to steady self  Ambulation/Gait Ambulation/Gait assistance: Min assist;Min guard Gait Distance (Feet): 80 Feet Assistive device: 1 person hand held assist;IV Pole Gait Pattern/deviations: Step-through pattern;Decreased step length - right;Decreased step length - left;Decreased stride length;Drifts right/left;Narrow base of support Gait velocity: decreased   General Gait Details: increased c/o nausea with increased distance.  No AD   Stairs             Wheelchair Mobility    Modified Rankin (Stroke Patients Only)       Balance                                            Cognition Arousal/Alertness: Awake/alert Behavior During Therapy: WFL for tasks assessed/performed Overall Cognitive Status: Within Functional Limits for tasks assessed                                         Exercises      General Comments        Pertinent Vitals/Pain Pain Assessment: Faces Faces Pain Scale: Hurts a little bit Pain Location: ABD Pain Descriptors / Indicators: Aching Pain Intervention(s): Monitored during session    Home Living                      Prior Function            PT Goals (current goals can now be found in the care plan section) Acute Rehab PT Goals Patient Stated Goal: pt hoping to get stronger and improve her balance and walking to go home Progress towards PT goals: Progressing toward goals    Frequency    Min 3X/week      PT Plan Current plan remains appropriate    Co-evaluation              AM-PAC PT "6 Clicks" Mobility   Outcome Measure  Help needed turning from your back to your side while in a flat bed without using bedrails?: A Little Help needed moving from lying on your back to sitting on the side of a flat bed without using bedrails?: A Little Help needed moving to and  from a bed to a chair (including a wheelchair)?: A Little Help needed standing up from a chair using your arms (e.g., wheelchair or bedside chair)?: A Little Help needed to walk in hospital room?: A Little Help needed climbing 3-5 steps with a railing? : A Lot 6 Click Score: 17    End of Session Equipment Utilized During Treatment: Gait belt Activity Tolerance: Patient tolerated treatment well Patient left: in bed;with call bell/phone within reach Nurse Communication: Other (comment)(pt requesting nausea meds) PT Visit Diagnosis: Unsteadiness on feet (R26.81);Difficulty in walking, not elsewhere classified (R26.2)     Time: [id number] PT Time Calculation (min) (ACUTE ONLY): 11 min  Charges:  $Gait Training: 8-22 mins                     Rica Koyanagi  PTA Acute  Rehabilitation Services Pager      [phone] Office      [phone]

## 2019-06-17 NOTE — Progress Notes (Addendum)
[facility]  PROGRESS NOTE  Ellen Larson [id number] DOB: Apr 26, 1956 DOA: 06/10/2019 PCP: Patient, No Pcp Per  Brief History    Zyanna Larson is a 63 y.o. year old female with no significant medical history  who presented on 06/10/2019 with nausea, vomiting, abdominal pain and unexplained significant weight loss over several weeks.  She presented to ED after outpatient evaluation for umbilical hernia based on her symptoms was sent to the ED, patient was found to have colonic wall thickening highly suspicious for primary colonic malignancy and concern for mesenteric implant causing small bowel obstruction.  Patient underwent exploratory laparotomy with small bowel resection and primary anastomosis/partial colectomy with colostomy placed on 9/14  A & P     Malignant small bowel obstruction, due to persistent SBO underwent partial colectomy with colostomy and small bowel resection on 9/14.  Given recent abdominal surgery patient started on TPN on 9/15 for nutritional support until bowel function returns.  NG tube removed but barely tolerating clear liquids with dry heaves and only minimal input, will need to continue TPN, surgery consultants following,  Judicious use of pain control as patient became very somnolent with PCA pump earlier in hospitalization, currently on IV morphine will closely monitor respiratory status/mental status ,   PRN Tylenol and scheduled IV Robaxin for pain control. Pt recs HHPT and supervision or SNF if family not able to provide   Transverse colon carcinoma with omental metastasis, peritoneal carcinomatosis, liver metastasis.  Operative findings with large primary tumor in proximal transverse colon.  Pathology pending, consult oncology once available   Hypokalemia, resolved.  Received repletion IV, closely monitor while on TPN   Hypophosphatemia resolved.  Received repletion IV, closely monitor while on TPN   Hyponatremia.  Sodium stable at 131,  continue to closely monitor.   Unintentional weight loss, likely secondary to malignancy.  Prealbumin less than 5, TPN for nutritional support, dietary consulted   Normocytic anemia hemoglobin  stable . likely related to suspected GI malignancy.  Closely monitor, no active signs or symptoms of bleeding.     DVT prophylaxis: lovenox Code Status: Full Family Communication: No family at bedside Disposition Plan: Needs continued inpatient stay given need for TPN while awaiting bowel function return inability to tolerate oral diet, close monitoring of electrolytes/nutrients requiring IV repletion and IV pain control     [facility] Direct contact: see [email or web address] (further directions at bottom of note if needed) 7PM-7AM contact night coverage as at bottom of note 06/17/2019, 6:03 PM  LOS: 6 days   Consultants  . General Surgery  Procedures  partial colectomy with colostomy and small bowel resection on 9/14  Antibiotics  . Ertapenem, 9/14  Interval History/Subjective  Still having dry heaves with minimal intake of liquid diet Feels belly pain controlled on current pain regimen   Objective   Vitals:  Vitals:   06/17/19 0949 06/17/19 1405  BP: (!) 146/80 135/70  Pulse: (!) 103 99  Resp: 16 16  Temp: 98.9 F (37.2 C) 97.8 F (36.6 C)  SpO2: 96% 96%    Exam:  Awake Alert, Oriented X 3, No new F.N deficits, Normal affect [place].AT Symmetrical Chest wall movement, Good air movement bilaterally, CTAB RRR,No Gallops,Rubs or new Murmurs Minimal bowel sounds, appropriately tender with palpation, dressings in place at incision site of central abdomen, colostomy bag in place with no liquid output  No Cyanosis, Clubbing or edema, No new Rash or bruise    I have personally reviewed the following:   Data  Reviewed: Basic Metabolic Panel: Recent Labs  Lab 06/13/19 0350 06/14/19 0251 06/15/19 0418 06/16/19 0414 06/17/19 0356  NA 139 135 131* 132* 131*  K 3.9 4.2 3.6 4.0  4.2  CL 104 101 100 98 100  CO2 28 28 24 28 23   GLUCOSE 121* 181* 115* 135* 134*  BUN <5* <5* 8 9 10   CREATININE 0.43* 0.33* 0.33* 0.33* <0.30*  CALCIUM 7.9* 7.9* 7.8* 7.6* 7.8*  MG 2.2 1.8 1.9 2.3 2.2  PHOS 3.2 2.6 2.3* 2.6 2.5   Liver Function Tests: Recent Labs  Lab 06/12/19 0324 06/13/19 0350 06/14/19 0251 06/15/19 0418 06/16/19 0414  AST 19 18 14* 15 14*  ALT 17 13 11 11 11   ALKPHOS 112 94 75 72 83  BILITOT 0.4 0.4 0.4 0.3 0.4  PROT 5.9* 5.1* 5.5* 4.7* 5.3*  ALBUMIN 2.7* 2.3* 2.6* 2.1* 2.3*   No results for input(s): LIPASE, AMYLASE in the last 168 hours. No results for input(s): AMMONIA in the last 168 hours. CBC: Recent Labs  Lab 06/11/19 0613 06/12/19 0324 06/13/19 0350 06/14/19 0251 06/15/19 0418  WBC 11.5* 10.7* 9.2 11.2* 11.5*  NEUTROABS  --  7.5 6.2 9.1* 9.3*  HGB 12.0 12.3 10.7* 11.5* 10.5*  HCT 36.6 38.9 33.6* 36.3 33.0*  MCV 83.8 84.6 84.4 85.0 85.9  PLT 361 414* 320 313 316   Cardiac Enzymes: No results for input(s): CKTOTAL, CKMB, CKMBINDEX, TROPONINI in the last 168 hours. BNP (last 3 results) No results for input(s): BNP in the last 8760 hours.  ProBNP (last 3 results) No results for input(s): PROBNP in the last 8760 hours.  CBG: Recent Labs  Lab 06/16/19 1152 06/16/19 1910 06/17/19 0058 06/17/19 0534 06/17/19 1403  GLUCAP 132* 139* 135* 140* 126*    Recent Results (from the past 240 hour(s))  SARS Coronavirus 2 [facility] order, Performed in [facility] hospital lab) Nasopharyngeal Nasopharyngeal Swab     Status: None   Collection Time: 06/11/19  1:02 AM   Specimen: Nasopharyngeal Swab  Result Value Ref Range Status   SARS Coronavirus 2 NEGATIVE NEGATIVE Final    Comment: (NOTE) If result is NEGATIVE SARS-CoV-2 target nucleic acids are NOT DETECTED. The SARS-CoV-2 RNA is generally detectable in upper and lower  respiratory specimens during the acute phase of infection. The lowest  concentration of SARS-CoV-2 viral copies this  assay can detect is 250  copies / mL. A negative result does not preclude SARS-CoV-2 infection  and should not be used as the sole basis for treatment or other  patient management decisions.  A negative result may occur with  improper specimen collection / handling, submission of specimen other  than nasopharyngeal swab, presence of viral mutation(s) within the  areas targeted by this assay, and inadequate number of viral copies  (<250 copies / mL). A negative result must be combined with clinical  observations, patient history, and epidemiological information. If result is POSITIVE SARS-CoV-2 target nucleic acids are DETECTED. The SARS-CoV-2 RNA is generally detectable in upper and lower  respiratory specimens dur ing the acute phase of infection.  Positive  results are indicative of active infection with SARS-CoV-2.  Clinical  correlation with patient history and other diagnostic information is  necessary to determine patient infection status.  Positive results do  not rule out bacterial infection or co-infection with other viruses. If result is PRESUMPTIVE POSTIVE SARS-CoV-2 nucleic acids MAY BE PRESENT.   A presumptive positive result was obtained on the submitted specimen  and confirmed on  repeat testing.  While 2019 novel coronavirus  (SARS-CoV-2) nucleic acids may be present in the submitted sample  additional confirmatory testing may be necessary for epidemiological  and / or clinical management purposes  to differentiate between  SARS-CoV-2 and other Sarbecovirus currently known to infect humans.  If clinically indicated additional testing with an alternate test  methodology [phone]) is advised. The SARS-CoV-2 RNA is generally  detectable in upper and lower respiratory sp ecimens during the acute  phase of infection. The expected result is Negative. Fact Sheet for Patients:  StrictlyIdeas.no Fact Sheet for Healthcare Providers:  BankingDealers.co.za This test is not yet approved or cleared by the [place] FDA and has been authorized for detection and/or diagnosis of SARS-CoV-2 by FDA under an Emergency Use Authorization (EUA).  This EUA will remain in effect (meaning this test can be used) for the duration of the COVID-19 declaration under Section 564(b)(1) of the Act, 21 U.S.C. section 360bbb-3(b)(1), unless the authorization is terminated or revoked sooner. Performed at [facility], [place] [address] [place],  [address]      Studies: No results found.  Scheduled Meds: . Chlorhexidine Gluconate Cloth  6 each Topical Daily  . enoxaparin (LOVENOX) injection  40 mg Subcutaneous Q24H  . insulin aspart  0-15 Units Subcutaneous Q8H  . sodium chloride flush  10-40 mL Intracatheter Q12H  . sodium chloride flush  3 mL Intravenous Once   Continuous Infusions: . sodium chloride 20 mL/hr at 06/17/19 1500  . methocarbamol (ROBAXIN) IV 500 mg (06/16/19 2057)  . TPN ADULT (ION) 80 mL/hr at 06/17/19 1716    Principal Problem:   SBO (small bowel obstruction) (HCC) Active Problems:   Hypokalemia   Weight loss   Suspected Colon cancer metastasized to liver ([place])   Hypophosphatemia   Normocytic anemia      Desiree Hane  [facility]

## 2019-06-17 NOTE — Care Management Important Message (Signed)
Important Message  Patient Details IM Letter given to Kathrin Greathouse SW to present to the Patient Name: Ellen Larson MRN: [id number] Date of Birth: [birth year]   Medicare Important Message Given:  Yes     Kerin Salen 06/17/2019, 11:48 AM

## 2019-06-17 NOTE — Progress Notes (Signed)
Patient politely requested to have time to think about Home Health (PT, RN) services. TOC staff will follow up with the patient.

## 2019-06-17 NOTE — Progress Notes (Signed)
Pt ambulated in hall, 21ft, with no assistive device, tolerated fair; reported pain 9/10 on abdomen and surgical stitches,PRN pain med given; back in bed, no signs of distress noted;

## 2019-06-17 NOTE — Progress Notes (Signed)
PHARMACY - ADULT TOTAL PARENTERAL NUTRITION CONSULT NOTE   Pharmacy Consult for TPN Indication: anticipated prolonged PO intolerance  Patient Measurements: Height: 5\' 3"  (160 cm) Weight: 114 lb 3.2 oz (51.8 kg)(standing scale) IBW/kg (Calculated) : 52.4 TPN AdjBW (KG): 49 Body mass index is 20.23 kg/m. Usual Weight: 58 kg  HPI: 62 yoF with PMH renal disease presents 9/11 with n/v and abdominal pain along with 20 lbs weight loss. Has not seen MD in > 20 yrs. CT reveals SBO d/t transverse colon mass; also noted to have omental and liver metastases. On 9/14 underwent ex lap with resection of ileum, partial colectomy, and ascending colostomy. Pharmacy consulted to start TPN while patient remains NPO  Central access: 2x lumen PICC placed 9/14 TPN start date: 9/15  Significant events:  - 9/14: to OR for primary procedure (see HPI) - 9/17: advancing to clears, remove NGT  ASSESSMENT                                                                                                           Today, 06/17/2019:  Glucose (CBG goal 100-150) - well controlled on goal-rate TPN (range 132-140)  5 unit SSI yesterday  No Hx DM  Electrolytes - WNL except Na remains slightly low but stable  Renal - SCr remains low; BUN and bicarb WNL; good UOP  LFTs - WNL except albumin low/stable (9/17)  TGs - WNL (9/16)  Prealbumin - undetectable (9/16)  Current Nutrition - CLD  IVF - NS at 20 ml/hr  NUTRITIONAL GOALS                                                                                             RD recs (updated 9/16 d/t cancer and recent surgery): Kcal:  1900-2100 kcal Protein:  95-105 grams Fluid:  >/= 1.8 L/day   PLAN                                                                                                                         At 1800 today:  Continue TPN at 80 ml/hr  TPN at goal rate of 80 ml/hr provides 96 g of protein and  1940 kcals, meeting 100% of patient  needs  TPN to contain MVI and trace elements daily  Electrolytes: increase Na; no other changes; Cl:Ac = 2:1  Continue IVF at 20 ml/hr  Continue moderate scale SSI; will reduce to q8 hr CBG checks  TPN lab panels on Mondays & Thursdays  [place], PharmD, BCPS [phone] 06/17/2019, 9:22 AM

## 2019-06-18 ENCOUNTER — Inpatient Hospital Stay (HOSPITAL_COMMUNITY): Payer: Medicare Other

## 2019-06-18 DIAGNOSIS — E43 Unspecified severe protein-calorie malnutrition: Secondary | ICD-10-CM

## 2019-06-18 LAB — BASIC METABOLIC PANEL
Anion gap: 8 (ref 5–15)
BUN: 12 mg/dL (ref 8–23)
CO2: 24 mmol/L (ref 22–32)
Calcium: 8 mg/dL — ABNORMAL LOW (ref 8.9–10.3)
Chloride: 98 mmol/L (ref 98–111)
Creatinine, Ser: <0.3 mg/dL — ABNORMAL LOW (ref 0.44–1.00)
Glucose, Bld: 139 mg/dL — ABNORMAL HIGH (ref 70–99)
Potassium: 4 mmol/L (ref 3.5–5.1)
Sodium: 130 mmol/L — ABNORMAL LOW (ref 135–145)

## 2019-06-18 LAB — GLUCOSE, CAPILLARY
Glucose-Capillary: 123 mg/dL — ABNORMAL HIGH (ref 70–99)
Glucose-Capillary: 128 mg/dL — ABNORMAL HIGH (ref 70–99)
Glucose-Capillary: 140 mg/dL — ABNORMAL HIGH (ref 70–99)
Glucose-Capillary: 152 mg/dL — ABNORMAL HIGH (ref 70–99)

## 2019-06-18 MED ORDER — SODIUM CHLORIDE (PF) 0.9 % IJ SOLN
INTRAMUSCULAR | Status: AC
  Filled 2019-06-18: qty 50

## 2019-06-18 MED ORDER — TRAVASOL 10 % IV SOLN
INTRAVENOUS | Status: AC
  Administered 2019-06-18: 18:00:00 via INTRAVENOUS
  Filled 2019-06-18: qty 960

## 2019-06-18 MED ORDER — IOHEXOL 300 MG/ML  SOLN
75.0000 mL | Freq: Once | INTRAMUSCULAR | Status: AC | PRN
  Administered 2019-06-18: 75 mL via INTRAVENOUS

## 2019-06-18 NOTE — Progress Notes (Signed)
[facility]  PROGRESS NOTE  [name] [id number] DOB: October 02, 1955 DOA: 06/10/2019 PCP: Patient, No Pcp Per  Brief History    Ellen Larson is a 63 y.o. year old female with no significant medical history  who presented on 06/10/2019 with nausea, vomiting, abdominal pain and unexplained significant weight loss over several weeks.  She presented to ED after outpatient evaluation for umbilical hernia based on her symptoms was sent to the ED, patient was found to have colonic wall thickening highly suspicious for primary colonic malignancy and concern for mesenteric implant causing small bowel obstruction.  Patient underwent exploratory laparotomy with small bowel resection and primary anastomosis/partial colectomy with colostomy placed on 9/14  A & P     Malignant small bowel obstruction, due to persistent SBO underwent partial colectomy with colostomy and small bowel resection on 9/14.  Given recent abdominal surgery patient started on TPN on 9/15 for nutritional support until bowel function returns.  NG tube removed but still only minimal tolerance to clear liquids  will need to continue TPN until able to tolerate adequate oral diet, surgery consultants following,  Judicious use of pain control as patient became very somnolent with PCA pump earlier in hospitalization, currently on IV morphine will closely monitor respiratory status/mental status ,   PRN Tylenol and scheduled IV Robaxin for pain control. Pt recs HHPT and supervision or SNF if family not able to provide   Transverse colon carcinoma with omental metastasis, peritoneal carcinomatosis, liver metastasis.  Operative findings with large primary tumor in proximal transverse colon.  Pathology pending, oncology plans for close outpatient follow-up   Hypokalemia, resolved.  Received repletion IV, closely monitor while on TPN   Hypophosphatemia resolved.  Received repletion IV, closely monitor while on TPN    Hyponatremia.  Sodium stable at 131, continue to closely monitor.   Unintentional weight loss, likely secondary to malignancy.  Prealbumin less than 5, TPN for nutritional support, dietary consulted   Normocytic anemia hemoglobin  stable . likely related to suspected GI malignancy.  Closely monitor, no active signs or symptoms of bleeding.     DVT prophylaxis: lovenox Code Status: Full Family Communication: No family at bedside Disposition Plan: Needs continued inpatient stay given need for TPN while awaiting bowel function return and inability to tolerate oral diet, close monitoring of electrolytes/nutrients requiring IV repletion and IV pain control     [facility] Direct contact: see [email or web address] (further directions at bottom of note if needed) 7PM-7AM contact night coverage as at bottom of note 06/18/2019, 5:21 PM  LOS: 7 days   Consultants  . General Surgery  Procedures  partial colectomy with colostomy and small bowel resection on 9/14  Antibiotics  . Ertapenem, 9/14  Interval History/Subjective  No acute events overnight Minimal ostomy output   Objective   Vitals:  Vitals:   06/18/19 0555 06/18/19 1347  BP: (!) 143/75 136/72  Pulse: (!) 104 98  Resp: 16 16  Temp: 99.7 F (37.6 C) 98.9 F (37.2 C)  SpO2: 97% 98%    Exam:  Awake Alert, Oriented X 3, No new F.N deficits, Normal affect [place].AT dressings in place at incision site of central abdomen, colostomy bag in place with no liquid output    I have personally reviewed the following:   Data Reviewed: Basic Metabolic Panel: Recent Labs  Lab 06/13/19 0350 06/14/19 0251 06/15/19 0418 06/16/19 0414 06/17/19 0356 06/18/19 0411  NA 139 135 131* 132* 131* 130*  K 3.9 4.2 3.6 4.0 4.2 4.0  CL 104 101 100 98 100 98  CO2 28 28 24 28 23 24   GLUCOSE 121* 181* 115* 135* 134* 139*  BUN <5* <5* 8 9 10 12   CREATININE 0.43* 0.33* 0.33* 0.33* <0.30* <0.30*  CALCIUM 7.9* 7.9* 7.8* 7.6* 7.8* 8.0*  MG  2.2 1.8 1.9 2.3 2.2  --   PHOS 3.2 2.6 2.3* 2.6 2.5  --    Liver Function Tests: Recent Labs  Lab 06/12/19 0324 06/13/19 0350 06/14/19 0251 06/15/19 0418 06/16/19 0414  AST 19 18 14* 15 14*  ALT 17 13 11 11 11   ALKPHOS 112 94 75 72 83  BILITOT 0.4 0.4 0.4 0.3 0.4  PROT 5.9* 5.1* 5.5* 4.7* 5.3*  ALBUMIN 2.7* 2.3* 2.6* 2.1* 2.3*   No results for input(s): LIPASE, AMYLASE in the last 168 hours. No results for input(s): AMMONIA in the last 168 hours. CBC: Recent Labs  Lab 06/12/19 0324 06/13/19 0350 06/14/19 0251 06/15/19 0418  WBC 10.7* 9.2 11.2* 11.5*  NEUTROABS 7.5 6.2 9.1* 9.3*  HGB 12.3 10.7* 11.5* 10.5*  HCT 38.9 33.6* 36.3 33.0*  MCV 84.6 84.4 85.0 85.9  PLT 414* 320 313 316   Cardiac Enzymes: No results for input(s): CKTOTAL, CKMB, CKMBINDEX, TROPONINI in the last 168 hours. BNP (last 3 results) No results for input(s): BNP in the last 8760 hours.  ProBNP (last 3 results) No results for input(s): PROBNP in the last 8760 hours.  CBG: Recent Labs  Lab 06/17/19 0534 06/17/19 1403 06/17/19 2351 06/18/19 0557 06/18/19 1358  GLUCAP 140* 126* 152* 140* 128*    Recent Results (from the past 240 hour(s))  SARS Coronavirus 2 [facility] order, Performed in [facility] hospital lab) Nasopharyngeal Nasopharyngeal Swab     Status: None   Collection Time: 06/11/19  1:02 AM   Specimen: Nasopharyngeal Swab  Result Value Ref Range Status   SARS Coronavirus 2 NEGATIVE NEGATIVE Final    Comment: (NOTE) If result is NEGATIVE SARS-CoV-2 target nucleic acids are NOT DETECTED. The SARS-CoV-2 RNA is generally detectable in upper and lower  respiratory specimens during the acute phase of infection. The lowest  concentration of SARS-CoV-2 viral copies this assay can detect is 250  copies / mL. A negative result does not preclude SARS-CoV-2 infection  and should not be used as the sole basis for treatment or other  patient management decisions.  A negative result may occur  with  improper specimen collection / handling, submission of specimen other  than nasopharyngeal swab, presence of viral mutation(s) within the  areas targeted by this assay, and inadequate number of viral copies  (<250 copies / mL). A negative result must be combined with clinical  observations, patient history, and epidemiological information. If result is POSITIVE SARS-CoV-2 target nucleic acids are DETECTED. The SARS-CoV-2 RNA is generally detectable in upper and lower  respiratory specimens dur ing the acute phase of infection.  Positive  results are indicative of active infection with SARS-CoV-2.  Clinical  correlation with patient history and other diagnostic information is  necessary to determine patient infection status.  Positive results do  not rule out bacterial infection or co-infection with other viruses. If result is PRESUMPTIVE POSTIVE SARS-CoV-2 nucleic acids MAY BE PRESENT.   A presumptive positive result was obtained on the submitted specimen  and confirmed on repeat testing.  While 2019 novel coronavirus  (SARS-CoV-2) nucleic acids may be present in the submitted sample  additional confirmatory testing may be necessary for epidemiological  and / or  clinical management purposes  to differentiate between  SARS-CoV-2 and other Sarbecovirus currently known to infect humans.  If clinically indicated additional testing with an alternate test  methodology [phone]) is advised. The SARS-CoV-2 RNA is generally  detectable in upper and lower respiratory sp ecimens during the acute  phase of infection. The expected result is Negative. Fact Sheet for Patients:  StrictlyIdeas.no Fact Sheet for Healthcare Providers: BankingDealers.co.za This test is not yet approved or cleared by the [place] FDA and has been authorized for detection and/or diagnosis of SARS-CoV-2 by FDA under an Emergency Use Authorization (EUA).  This EUA  will remain in effect (meaning this test can be used) for the duration of the COVID-19 declaration under Section 564(b)(1) of the Act, 21 U.S.C. section 360bbb-3(b)(1), unless the authorization is terminated or revoked sooner. Performed at [facility], [place] [address] [place], [place] [address]      Studies: Ct Chest W Contrast  Result Date: 06/18/2019 CLINICAL DATA:  Stage IV colon cancer EXAM: CT CHEST WITH CONTRAST TECHNIQUE: Multidetector CT imaging of the chest was performed during intravenous contrast administration. CONTRAST:  53mL OMNIPAQUE IOHEXOL 300 MG/ML  SOLN COMPARISON:  Correlation with CT abdomen pelvis dated 06/11/2019 FINDINGS: Cardiovascular: The heart is normal in size. No pericardial effusion. No evidence of thoracic aortic aneurysm. Coronary atherosclerosis of the LAD. Right arm PICC terminates at the cavoatrial junction. Mediastinum/Nodes: No suspicious mediastinal, hilar, or axillary lymphadenopathy. 9 mm right thyroid nodule. Lungs/Pleura: Mild biapical pleural-parenchymal scarring. Mild centrilobular emphysematous changes, upper lung predominant. Mild bilateral lower lobe atelectasis with small bilateral pleural effusions. No suspicious pulmonary nodules. No pneumothorax. Upper Abdomen: Visualized upper abdomen is better evaluated on recent CT, noting hepatic metastases and ascites. Known omental caking and the primary colonic lesion are not visualized on the current study. Musculoskeletal: Visualized osseous structures are within normal limits. IMPRESSION: No evidence of metastatic disease in the chest. Small bilateral pleural effusions. Hepatic metastases and upper abdominal ascites, better visualized on recent CT abdomen/pelvis, along with additional pertinent findings. Emphysema (ICD10-J43.9). Electronically Signed   By: Julian Hy M.D.   On: 06/18/2019 12:41    Scheduled Meds: . Chlorhexidine Gluconate Cloth  6 each Topical Daily  .  enoxaparin (LOVENOX) injection  40 mg Subcutaneous Q24H  . insulin aspart  0-15 Units Subcutaneous Q8H  . sodium chloride (PF)      . sodium chloride flush  10-40 mL Intracatheter Q12H   Continuous Infusions: . sodium chloride 20 mL/hr at 06/18/19 0600  . methocarbamol (ROBAXIN) IV 500 mg (06/16/19 2057)  . TPN ADULT (ION) 80 mL/hr at 06/18/19 0600  . TPN ADULT (ION)      Principal Problem:   SBO (small bowel obstruction) (HCC) Active Problems:   Hypokalemia   Weight loss   Suspected Colon cancer metastasized to liver (HCC)   Hypophosphatemia   Normocytic anemia   Protein-calorie malnutrition, severe ([place])      Myrlene Broker D Nettey  [facility]

## 2019-06-18 NOTE — Progress Notes (Signed)
[place] NOTE   Pharmacy Consult for TPN Indication: anticipated prolonged PO intolerance  Patient Measurements: Height: 5\' 3"  (160 cm) Weight: 114 lb 3.2 oz (51.8 kg)(standing scale) IBW/kg (Calculated) : 52.4 TPN AdjBW (KG): 49 Body mass index is 20.23 kg/m. Usual Weight: 58 kg  HPI: 70 yoF with PMH renal disease presents 9/11 with n/v and abdominal pain along with 20 lbs weight loss. Has not seen MD in > 20 yrs. CT reveals SBO d/t transverse colon mass; also noted to have omental and liver metastases. On 9/14 underwent ex lap with resection of ileum, partial colectomy, and ascending colostomy. Pharmacy consulted to start TPN while patient remains NPO  Central access: 2x lumen PICC placed 9/14 TPN start date: 9/15  Significant events:  - 9/14: to OR for primary procedure (see HPI) - 9/17: advancing to clears, remove NGT  ASSESSMENT                                                                                                           Today, 06/18/2019:  Glucose (CBG goal 100-150) - mostly well controlled on goal-rate TPN (range 126-152)  9 unit SSI yesterday  No Hx DM  Electrolytes - WNL except Na low  Renal - SCr remains low; BUN and bicarb WNL; good UOP  LFTs - WNL except albumin low/stable (9/17)  TGs - WNL (9/16)  Prealbumin - undetectable (9/16)  Current Nutrition - CLD  IVF - NS at 20 ml/hr  NUTRITIONAL GOALS                                                                                             RD recs (updated 9/16 d/t cancer and recent surgery): Kcal:  1900-2100 kcal Protein:  95-105 grams Fluid:  >/= 1.8 L/day   PLAN                                                                                                                         At 1800 today:  Continue TPN at 80 ml/hr  TPN at goal rate of 80 ml/hr provides 96 g of protein and 1940 kcals, meeting  100% of patient needs  TPN to contain MVI  and trace elements daily  Electrolytes: increase Na & Phos; no other changes; Cl:Ac = max Cl  Continue IVF at 20 ml/hr  Continue moderate scale SSI; will reduce to q8 hr CBG checks  TPN lab panels on Mondays & Thursdays  [place], PharmD, BCPS [phone] 06/18/2019, 10:10 AM

## 2019-06-18 NOTE — Progress Notes (Signed)
  [facility] Progress Note  5 Days Post-Op  Subjective: CC-  Still sore but improving. Some nausea last night and if she takes more than 10 sips of liquids; no emesis. Ostomy with more output - liquid at this time  Objective: Vital signs in last 24 hours: Temp:  [97.8 F (36.6 C)-99.7 F (37.6 C)] 99.7 F (37.6 C) (09/19 0555) Pulse Rate:  [99-104] 104 (09/19 0555) Resp:  [16] 16 (09/19 0555) BP: (135-149)/(70-80) 143/75 (09/19 0555) SpO2:  [96 %-98 %] 97 % (09/19 0555) Last BM Date: 06/17/19  Intake/Output from previous day: 09/18 0701 - 09/19 0700 In: 2088.7 [P.O.:180; I.V.:1908.7] Out: 2925 [Urine:2125; Stool:800] Intake/Output this shift: No intake/output data recorded.  PE: Gen:  Alert, NAD, pleasant HEENT: EOM's intact, pupils equal and round Pulm:  Rate and effort normal Abd: Soft, appropriately tender around incision which is c/d/i without erythema or drainage, ostomy viable with air and liquid brown stool in pouch Skin: warm and dry  Lab Results:  No results for input(s): WBC, HGB, HCT, PLT in the last 72 hours. BMET Recent Labs    06/17/19 0356 06/18/19 0411  NA 131* 130*  K 4.2 4.0  CL 100 98  CO2 23 24  GLUCOSE 134* 139*  BUN 10 12  CREATININE <0.30* <0.30*  CALCIUM 7.8* 8.0*   PT/INR No results for input(s): LABPROT, INR in the last 72 hours. CMP     Component Value Date/Time   NA 130 (L) 06/18/2019 0411   K 4.0 06/18/2019 0411   CL 98 06/18/2019 0411   CO2 24 06/18/2019 0411   GLUCOSE 139 (H) 06/18/2019 0411   BUN 12 06/18/2019 0411   CREATININE <0.30 (L) 06/18/2019 0411   CALCIUM 8.0 (L) 06/18/2019 0411   PROT 5.3 (L) 06/16/2019 0414   ALBUMIN 2.3 (L) 06/16/2019 0414   AST 14 (L) 06/16/2019 0414   ALT 11 06/16/2019 0414   ALKPHOS 83 06/16/2019 0414   BILITOT 0.4 06/16/2019 0414   GFRNONAA NOT CALCULATED 06/18/2019 0411   GFRAA NOT CALCULATED 06/18/2019 0411   Lipase     Component Value Date/Time   LIPASE 22  06/10/2019 1248       Studies/Results: No results found.  Anti-infectives: Anti-infectives (From admission, onward)   Start     Dose/Rate Route Frequency Ordered Stop   06/13/19 0933  ertapenem (INVANZ) 1,000 mg in sodium chloride 0.9 % 100 mL IVPB     1 g 200 mL/hr over 30 Minutes Intravenous 30 min pre-op 06/13/19 0930 06/13/19 1042       Assessment/Plan Malnutrition - prealbumin <5 (9/16), continue TPN  Transverse colon carcinoma with omental metastasis, peritoneal carcinomatosis, liver metastasis, malignant small bowel obstruction S/p Exploratory laparotomy, small bowel resection with primary anastomosis, partial colectomy with ascending colostomy 9/14 Dr. Harlow Asa - POD #5 - surgical pathology pending - NG out 9/17  ID - invanz periop. TMAX 99.7 FEN - IVF, TPN, continue CLD today given smoldering nausea; prns ordered as well VTE - SCDs, lovenox Foley - d/c 9/16 Follow up - Dr. Harlow Asa  Plan: Continue CLD and await improved bowel function. Continue TPN until able to tolerate adequate diet PO. Mobilize. Appreciate oncology recommendations. Path pending   LOS: 7 days   Sharon Mt. Dema Severin, M.D. [facility] [phone]

## 2019-06-19 DIAGNOSIS — R0602 Shortness of breath: Secondary | ICD-10-CM

## 2019-06-19 LAB — BASIC METABOLIC PANEL
Anion gap: 8 (ref 5–15)
BUN: 15 mg/dL (ref 8–23)
CO2: 25 mmol/L (ref 22–32)
Calcium: 8.5 mg/dL — ABNORMAL LOW (ref 8.9–10.3)
Chloride: 102 mmol/L (ref 98–111)
Creatinine, Ser: <0.3 mg/dL — ABNORMAL LOW (ref 0.44–1.00)
Glucose, Bld: 109 mg/dL — ABNORMAL HIGH (ref 70–99)
Potassium: 4.3 mmol/L (ref 3.5–5.1)
Sodium: 135 mmol/L (ref 135–145)

## 2019-06-19 LAB — GLUCOSE, CAPILLARY
Glucose-Capillary: 124 mg/dL — ABNORMAL HIGH (ref 70–99)
Glucose-Capillary: 120 mg/dL — ABNORMAL HIGH (ref 70–99)
Glucose-Capillary: 110 mg/dL — ABNORMAL HIGH (ref 70–99)

## 2019-06-19 MED ORDER — ADULT MULTIVITAMIN W/MINERALS CH
1.0000 | ORAL_TABLET | Freq: Every day | ORAL | Status: DC
  Administered 2019-06-20 – 2019-06-23 (×4): 1 via ORAL
  Filled 2019-06-19 (×5): qty 1

## 2019-06-19 MED ORDER — METOCLOPRAMIDE HCL 10 MG/10ML PO SOLN
10.0000 mg | Freq: Three times a day (TID) | ORAL | Status: DC
  Administered 2019-06-19 – 2019-06-21 (×7): 10 mg via ORAL
  Filled 2019-06-19 (×13): qty 10

## 2019-06-19 MED ORDER — METOCLOPRAMIDE HCL 5 MG/5ML PO SOLN
10.0000 mg | Freq: Three times a day (TID) | ORAL | Status: DC
  Administered 2019-06-19 (×2): 10 mg via ORAL
  Filled 2019-06-19 (×3): qty 10

## 2019-06-19 MED ORDER — TRAVASOL 10 % IV SOLN
INTRAVENOUS | Status: AC
  Administered 2019-06-19: 17:00:00 via INTRAVENOUS
  Filled 2019-06-19: qty 960

## 2019-06-19 NOTE — Progress Notes (Signed)
[place] NOTE   Pharmacy Consult for TPN Indication: anticipated prolonged PO intolerance  Patient Measurements: Height: 5\' 3"  (160 cm) Weight: 114 lb 3.2 oz (51.8 kg)(standing scale) IBW/kg (Calculated) : 52.4 TPN AdjBW (KG): 49 Body mass index is 20.23 kg/m. Usual Weight: 58 kg  HPI: 43 yoF with PMH renal disease presents 9/11 with n/v and abdominal pain along with 20 lbs weight loss. Has not seen MD in > 20 yrs. CT reveals SBO d/t transverse colon mass; also noted to have omental and liver metastases. On 9/14 underwent ex lap with resection of ileum, partial colectomy, and ascending colostomy. Pharmacy consulted to start TPN while patient remains NPO  Central access: 2x lumen PICC placed 9/14 TPN start date: 9/15  Significant events:  - 9/14: to OR for primary procedure (see HPI) - 9/17: advancing to clears, remove NGT -9/20: adv to soft diet, add reglan  ASSESSMENT                                                                                                           Today, 06/19/2019:  Glucose (CBG goal 100-150) -well controlled on goal-rate TPN. 6 unit SSI yesterday  No Hx DM  Electrolytes - WNL, Na up to 135  Renal - SCr remains low; BUN and bicarb WNL; good UOP  LFTs - WNL except albumin low/stable (9/17)  TGs - WNL (9/16)  Prealbumin - undetectable (9/16)  Current Nutrition - CLD  IVF - NS at 20 ml/hr  NUTRITIONAL GOALS                                                                                             RD recs (updated 9/16 d/t cancer and recent surgery): Kcal:  1900-2100 kcal Protein:  95-105 grams Fluid:  >/= 1.8 L/day   PLAN                                                                                                                         At 1800 today:  Continue TPN at 80 ml/hr  TPN at goal rate of 80 ml/hr provides 96 g of protein and  1940 kcals, meeting 100% of patient needs  Change to  PO MVI  Electrolytes: same. Cl:Ac = max Cl  Continue IVF at 20 ml/hr per MD  Continue moderate scale SSI; q8 hr CBG checks  TPN lab panels on Mondays & Thursdays Per [facility]: to continue TPN until able to tolerate PO diet  Eudelia Bunch, Pharm.D [phone] 06/19/2019 9:37 AM

## 2019-06-19 NOTE — Progress Notes (Signed)
[facility]  PROGRESS NOTE  [name] [id number] DOB: Sep 21, 1956 DOA: 06/10/2019 PCP: Patient, No Pcp Per  Brief History    Ellen Larson is a 63 y.o. year old female with no significant medical history  who presented on 06/10/2019 with nausea, vomiting, abdominal pain and unexplained significant weight loss over several weeks.  She presented to ED after outpatient evaluation for umbilical hernia based on her symptoms was sent to the ED, patient was found to have colonic wall thickening highly suspicious for primary colonic malignancy and concern for mesenteric implant causing small bowel obstruction.  Hospital course:  CT abdomen (06/11/2019)-acute small bowel obstruction, colonic wall thickening in transverse colon suspicious for primary colonic malignancy, abdominal pelvic ascites consistent with omental caking, small obstruction may be due to mesenteric implant, multiple hypodense liver lesions suspicious for metastatic disease  Abdominal x-ray (06/12/2019) multiple dilated loops of small bowel in central abdomen compatible with persistent partial small bowel obstruction  Patient underwent exploratory laparotomy with small bowel resection and primary anastomosis/partial colectomy with colostomy placed on 9/14.  Biopsy results pending, operative findings include carcinomatosis, liver metastasis, malignant small bowel obstruction, large primary tumor in proximal transverse colon  Due to poor nutritional status, prealbumin levels less than 5 patient was started on TPN postoperatively.  Hospital course currently consist of advancing diet  Dr. 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  Surgical pathology still pending.  Patient underwent CT chest with no evidence of metastatic disease on 9/19.  Plan for palliative treatment is metastatic disease is unresectable   A & P     Malignant small bowel obstruction, due to persistent SBO underwent partial colectomy with colostomy and small bowel resection on 9/14.  Given recent abdominal surgery patient started on TPN on 9/15 for nutritional support until bowel function returns.  NG tube removed but still only minimal tolerance to clear liquids, Reglan was added to improve ability to tolerate diet, advance diet to soft and monitor will need to continue TPN until able to tolerate adequate oral diet, surgery consultants following,  Judicious use of pain control as patient became very somnolent with PCA pump earlier in hospitalization, currently on IV morphine will closely monitor respiratory status/mental status ,   PRN Tylenol and scheduled IV Robaxin for pain control. Pt recs HHPT and supervision or SNF if family not able to provide   Transverse colon carcinoma with omental metastasis, peritoneal carcinomatosis, liver metastasis.  Operative findings with large primary tumor in proximal transverse colon.  Pathology pending, CT chest imaging with no evidence of metastatic disease, oncology plans for close outpatient follow-up.  Patient states she would like to follow-up at [facility] as her daughter lives in [place] for further care   Hypokalemia, resolved.  Received repletion IV, closely monitor while on TPN   Hypophosphatemia resolved.  Received repletion IV, closely monitor while on TPN   Hyponatremia, resolved. Currently 135.  continue to closely monitor.   Unintentional weight loss, likely secondary to malignancy.  Prealbumin less than 5, TPN for nutritional support, dietary consulted   Normocytic anemia hemoglobin  stable . likely related to suspected GI malignancy.  Closely monitor, no active signs or symptoms of bleeding.     DVT prophylaxis: lovenox Code Status: Full Family Communication: No family at bedside Disposition Plan: Needs continued inpatient stay given need for TPN while awaiting bowel  function return and inability to tolerate oral diet,  close monitoring of electrolytes/nutrients requiring IV repletion and IV pain control     [facility] Direct contact: see [email or web address] (further directions at bottom of note if needed) 7PM-7AM contact night coverage as at bottom of note 06/19/2019, 1:54 PM  LOS: 8 days   Consultants  . General Surgery, oncology  Procedures  partial colectomy with colostomy and small bowel resection on 9/14  Antibiotics  . Ertapenem, 9/14  Interval History/Subjective  No acute events overnight Minimal ostomy output   Objective   Vitals:  Vitals:   06/19/19 0555 06/19/19 1307  BP: 130/61 (!) 122/57  Pulse: 100 98  Resp: 17 16  Temp: 98.1 F (36.7 C) 98.7 F (37.1 C)  SpO2: 99% 99%    Exam:  Awake Alert, Oriented X 3, No new F.N deficits, Normal affect [place].AT Abdomen soft, appropriately tender, decreased bowel sounds, staples in place incision site of central abdomen, colostomy bag in place with brown liquid stool output    I have personally reviewed the following:   Data Reviewed: Basic Metabolic Panel: Recent Labs  Lab 06/13/19 0350 06/14/19 0251 06/15/19 0418 06/16/19 0414 06/17/19 0356 06/18/19 0411 06/19/19 0611  NA 139 135 131* 132* 131* 130* 135  K 3.9 4.2 3.6 4.0 4.2 4.0 4.3  CL 104 101 100 98 100 98 102  CO2 28 28 24 28 23 24 25   GLUCOSE 121* 181* 115* 135* 134* 139* 109*  BUN <5* <5* 8 9 10 12 15   CREATININE 0.43* 0.33* 0.33* 0.33* <0.30* <0.30* <0.30*  CALCIUM 7.9* 7.9* 7.8* 7.6* 7.8* 8.0* 8.5*  MG 2.2 1.8 1.9 2.3 2.2  --   --   PHOS 3.2 2.6 2.3* 2.6 2.5  --   --    Liver Function Tests: Recent Labs  Lab 06/13/19 0350 06/14/19 0251 06/15/19 0418 06/16/19 0414  AST 18 14* 15 14*  ALT 13 11 11 11   ALKPHOS 94 75 72 83  BILITOT 0.4 0.4 0.3 0.4  PROT 5.1* 5.5* 4.7* 5.3*  ALBUMIN 2.3* 2.6* 2.1* 2.3*   No results for input(s): LIPASE, AMYLASE in the last 168 hours. No results for input(s): AMMONIA  in the last 168 hours. CBC: Recent Labs  Lab 06/13/19 0350 06/14/19 0251 06/15/19 0418  WBC 9.2 11.2* 11.5*  NEUTROABS 6.2 9.1* 9.3*  HGB 10.7* 11.5* 10.5*  HCT 33.6* 36.3 33.0*  MCV 84.4 85.0 85.9  PLT 320 313 316   Cardiac Enzymes: No results for input(s): CKTOTAL, CKMB, CKMBINDEX, TROPONINI in the last 168 hours. BNP (last 3 results) No results for input(s): BNP in the last 8760 hours.  ProBNP (last 3 results) No results for input(s): PROBNP in the last 8760 hours.  CBG: Recent Labs  Lab 06/17/19 2351 06/18/19 0557 06/18/19 1358 06/18/19 2158 06/19/19 0556  GLUCAP 152* 140* 128* 123* 124*    Recent Results (from the past 240 hour(s))  SARS Coronavirus 2 [facility] order, Performed in [facility] hospital lab) Nasopharyngeal Nasopharyngeal Swab     Status: None   Collection Time: 06/11/19  1:02 AM   Specimen: Nasopharyngeal Swab  Result Value Ref Range Status   SARS Coronavirus 2 NEGATIVE NEGATIVE Final    Comment: (NOTE) If result is NEGATIVE SARS-CoV-2 target nucleic acids are NOT DETECTED. The SARS-CoV-2 RNA is generally detectable in upper and lower  respiratory specimens during the acute phase of infection. The lowest  concentration of SARS-CoV-2 viral copies this assay can detect is 250  copies / mL. A negative result does not  preclude SARS-CoV-2 infection  and should not be used as the sole basis for treatment or other  patient management decisions.  A negative result may occur with  improper specimen collection / handling, submission of specimen other  than nasopharyngeal swab, presence of viral mutation(s) within the  areas targeted by this assay, and inadequate number of viral copies  (<250 copies / mL). A negative result must be combined with clinical  observations, patient history, and epidemiological information. If result is POSITIVE SARS-CoV-2 target nucleic acids are DETECTED. The SARS-CoV-2 RNA is generally detectable in upper and lower   respiratory specimens dur ing the acute phase of infection.  Positive  results are indicative of active infection with SARS-CoV-2.  Clinical  correlation with patient history and other diagnostic information is  necessary to determine patient infection status.  Positive results do  not rule out bacterial infection or co-infection with other viruses. If result is PRESUMPTIVE POSTIVE SARS-CoV-2 nucleic acids MAY BE PRESENT.   A presumptive positive result was obtained on the submitted specimen  and confirmed on repeat testing.  While 2019 novel coronavirus  (SARS-CoV-2) nucleic acids may be present in the submitted sample  additional confirmatory testing may be necessary for epidemiological  and / or clinical management purposes  to differentiate between  SARS-CoV-2 and other Sarbecovirus currently known to infect humans.  If clinically indicated additional testing with an alternate test  methodology [phone]) is advised. The SARS-CoV-2 RNA is generally  detectable in upper and lower respiratory sp ecimens during the acute  phase of infection. The expected result is Negative. Fact Sheet for Patients:  StrictlyIdeas.no Fact Sheet for Healthcare Providers: BankingDealers.co.za This test is not yet approved or cleared by the [place] FDA and has been authorized for detection and/or diagnosis of SARS-CoV-2 by FDA under an Emergency Use Authorization (EUA).  This EUA will remain in effect (meaning this test can be used) for the duration of the COVID-19 declaration under Section 564(b)(1) of the Act, 21 U.S.C. section 360bbb-3(b)(1), unless the authorization is terminated or revoked sooner. Performed at [facility] [place] [address], [place], [place] [address]      Studies: Ct Chest W Contrast  Result Date: 06/18/2019 CLINICAL DATA:  Stage IV colon cancer EXAM: CT CHEST WITH CONTRAST TECHNIQUE: Multidetector CT  imaging of the chest was performed during intravenous contrast administration. CONTRAST:  21mL OMNIPAQUE IOHEXOL 300 MG/ML  SOLN COMPARISON:  Correlation with CT abdomen pelvis dated 06/11/2019 FINDINGS: Cardiovascular: The heart is normal in size. No pericardial effusion. No evidence of thoracic aortic aneurysm. Coronary atherosclerosis of the LAD. Right arm PICC terminates at the cavoatrial junction. Mediastinum/Nodes: No suspicious mediastinal, hilar, or axillary lymphadenopathy. 9 mm right thyroid nodule. Lungs/Pleura: Mild biapical pleural-parenchymal scarring. Mild centrilobular emphysematous changes, upper lung predominant. Mild bilateral lower lobe atelectasis with small bilateral pleural effusions. No suspicious pulmonary nodules. No pneumothorax. Upper Abdomen: Visualized upper abdomen is better evaluated on recent CT, noting hepatic metastases and ascites. Known omental caking and the primary colonic lesion are not visualized on the current study. Musculoskeletal: Visualized osseous structures are within normal limits. IMPRESSION: No evidence of metastatic disease in the chest. Small bilateral pleural effusions. Hepatic metastases and upper abdominal ascites, better visualized on recent CT abdomen/pelvis, along with additional pertinent findings. Emphysema (ICD10-J43.9). Electronically Signed   By: Julian Hy M.D.   On: 06/18/2019 12:41    Scheduled Meds: . Chlorhexidine Gluconate Cloth  6 each Topical Daily  . enoxaparin (LOVENOX) injection  40 mg Subcutaneous Q24H  . insulin aspart  0-15 Units Subcutaneous Q8H  . metoCLOPramide  10 mg Oral TID AC  . multivitamin with minerals  1 tablet Oral Daily  . sodium chloride flush  10-40 mL Intracatheter Q12H   Continuous Infusions: . sodium chloride 20 mL/hr at 06/19/19 1329  . methocarbamol (ROBAXIN) IV 500 mg (06/16/19 2057)  . TPN ADULT (ION) 80 mL/hr at 06/19/19 0600  . TPN ADULT (ION)      Principal Problem:   SBO (small bowel  obstruction) (HCC) Active Problems:   Hypokalemia   Weight loss   Suspected Colon cancer metastasized to liver (HCC)   Hypophosphatemia   Normocytic anemia   Protein-calorie malnutrition, severe ([place])      Myrlene Broker D Dilan [place]  [facility]

## 2019-06-19 NOTE — Progress Notes (Signed)
[facility] Progress Note  6 Days Post-Op  Subjective: CC-  Feeling much better today. Still with some nausea with liquids; no emesis. Ostomy working well  Objective: Vital signs in last 24 hours: Temp:  [98.1 F (36.7 C)-99.5 F (37.5 C)] 98.1 F (36.7 C) (09/20 0555) Pulse Rate:  [98-106] 100 (09/20 0555) Resp:  [16-17] 17 (09/20 0555) BP: (130-136)/(61-72) 130/61 (09/20 0555) SpO2:  [97 %-99 %] 99 % (09/20 0555) Last BM Date: 06/18/19  Intake/Output from previous day: 09/19 0701 - 09/20 0700 In: 1291.1 [P.O.:320; I.V.:971.1] Out: 2050 [Urine:1700; Stool:350] Intake/Output this shift: No intake/output data recorded.  PE: Gen:  Alert, NAD, pleasant HEENT: EOM's intact, pupils equal and round Pulm:  Rate and effort normal Abd: Soft, appropriately tender around incision which is c/d/i without erythema or drainage, ostomy viable with air and liquid brown stool in pouch Skin: warm and dry  Lab Results:  No results for input(s): WBC, HGB, HCT, PLT in the last 72 hours. BMET Recent Labs    06/18/19 0411 06/19/19 0611  NA 130* 135  K 4.0 4.3  CL 98 102  CO2 24 25  GLUCOSE 139* 109*  BUN 12 15  CREATININE <0.30* <0.30*  CALCIUM 8.0* 8.5*   PT/INR No results for input(s): LABPROT, INR in the last 72 hours. CMP     Component Value Date/Time   NA 135 06/19/2019 0611   K 4.3 06/19/2019 0611   CL 102 06/19/2019 0611   CO2 25 06/19/2019 0611   GLUCOSE 109 (H) 06/19/2019 0611   BUN 15 06/19/2019 0611   CREATININE <0.30 (L) 06/19/2019 0611   CALCIUM 8.5 (L) 06/19/2019 0611   PROT 5.3 (L) 06/16/2019 0414   ALBUMIN 2.3 (L) 06/16/2019 0414   AST 14 (L) 06/16/2019 0414   ALT 11 06/16/2019 0414   ALKPHOS 83 06/16/2019 0414   BILITOT 0.4 06/16/2019 0414   GFRNONAA NOT CALCULATED 06/19/2019 0611   GFRAA NOT CALCULATED 06/19/2019 0611   Lipase     Component Value Date/Time   LIPASE 22 06/10/2019 1248       Studies/Results: Ct Chest W  Contrast  Result Date: 06/18/2019 CLINICAL DATA:  Stage IV colon cancer EXAM: CT CHEST WITH CONTRAST TECHNIQUE: Multidetector CT imaging of the chest was performed during intravenous contrast administration. CONTRAST:  43mL OMNIPAQUE IOHEXOL 300 MG/ML  SOLN COMPARISON:  Correlation with CT abdomen pelvis dated 06/11/2019 FINDINGS: Cardiovascular: The heart is normal in size. No pericardial effusion. No evidence of thoracic aortic aneurysm. Coronary atherosclerosis of the LAD. Right arm PICC terminates at the cavoatrial junction. Mediastinum/Nodes: No suspicious mediastinal, hilar, or axillary lymphadenopathy. 9 mm right thyroid nodule. Lungs/Pleura: Mild biapical pleural-parenchymal scarring. Mild centrilobular emphysematous changes, upper lung predominant. Mild bilateral lower lobe atelectasis with small bilateral pleural effusions. No suspicious pulmonary nodules. No pneumothorax. Upper Abdomen: Visualized upper abdomen is better evaluated on recent CT, noting hepatic metastases and ascites. Known omental caking and the primary colonic lesion are not visualized on the current study. Musculoskeletal: Visualized osseous structures are within normal limits. IMPRESSION: No evidence of metastatic disease in the chest. Small bilateral pleural effusions. Hepatic metastases and upper abdominal ascites, better visualized on recent CT abdomen/pelvis, along with additional pertinent findings. Emphysema (ICD10-J43.9). Electronically Signed   By: Julian Hy M.D.   On: 06/18/2019 12:41    Anti-infectives: Anti-infectives (From admission, onward)   Start     Dose/Rate Route Frequency Ordered Stop   06/13/19 0933  ertapenem (INVANZ) 1,000 mg in  sodium chloride 0.9 % 100 mL IVPB     1 g 200 mL/hr over 30 Minutes Intravenous 30 min pre-op 06/13/19 0930 06/13/19 1042       Assessment/Plan Malnutrition - prealbumin <5 (9/16), continue TPN  Transverse colon carcinoma with omental metastasis, peritoneal  carcinomatosis, liver metastasis, malignant small bowel obstruction S/p Exploratory laparotomy, small bowel resection with primary anastomosis, partial colectomy with ascending colostomy 9/14 Dr. Harlow Asa - POD #6 - surgical pathology pending - NG out 9/17  ID - invanz periop. TMAX 99.7 FEN - IVF, TPN,  Add reglan, advance to soft diet; prns ordered as well VTE - SCDs, lovenox Foley - d/c 9/16 Follow up - Dr. Harlow Asa  Plan: Advance to soft diet; bowel function clearly present but still nauseated so will also add reglan. Continue TPN until able to tolerate adequate diet PO. Mobilize. Appreciate oncology recommendations. Path pending   LOS: 8 days   Sharon Mt. Dema Severin, M.D. [facility] [phone]

## 2019-06-20 ENCOUNTER — Telehealth: Payer: Self-pay | Admitting: Hematology

## 2019-06-20 ENCOUNTER — Encounter: Payer: Self-pay | Admitting: Hematology

## 2019-06-20 DIAGNOSIS — E871 Hypo-osmolality and hyponatremia: Secondary | ICD-10-CM

## 2019-06-20 DIAGNOSIS — R7401 Elevation of levels of liver transaminase levels: Secondary | ICD-10-CM | POA: Diagnosis not present

## 2019-06-20 LAB — PHOSPHORUS: Phosphorus: 3.8 mg/dL (ref 2.5–4.6)

## 2019-06-20 LAB — COMPREHENSIVE METABOLIC PANEL
ALT: 177 U/L — ABNORMAL HIGH (ref 0–44)
AST: 139 U/L — ABNORMAL HIGH (ref 15–41)
Albumin: 2.2 g/dL — ABNORMAL LOW (ref 3.5–5.0)
Alkaline Phosphatase: 323 U/L — ABNORMAL HIGH (ref 38–126)
Anion gap: 9 (ref 5–15)
BUN: 14 mg/dL (ref 8–23)
CO2: 24 mmol/L (ref 22–32)
Calcium: 8.3 mg/dL — ABNORMAL LOW (ref 8.9–10.3)
Chloride: 99 mmol/L (ref 98–111)
Creatinine, Ser: <0.3 mg/dL — ABNORMAL LOW (ref 0.44–1.00)
Glucose, Bld: 115 mg/dL — ABNORMAL HIGH (ref 70–99)
Potassium: 4.1 mmol/L (ref 3.5–5.1)
Sodium: 132 mmol/L — ABNORMAL LOW (ref 135–145)
Total Bilirubin: 0.4 mg/dL (ref 0.3–1.2)
Total Protein: 5.5 g/dL — ABNORMAL LOW (ref 6.5–8.1)

## 2019-06-20 LAB — CBC
HCT: 28.4 % — ABNORMAL LOW (ref 36.0–46.0)
Hemoglobin: 9.1 g/dL — ABNORMAL LOW (ref 12.0–15.0)
MCH: 26.9 pg (ref 26.0–34.0)
MCHC: 32 g/dL (ref 30.0–36.0)
MCV: 84 fL (ref 80.0–100.0)
Platelets: 350 10*3/uL (ref 150–400)
RBC: 3.38 MIL/uL — ABNORMAL LOW (ref 3.87–5.11)
RDW: 13.8 % (ref 11.5–15.5)
WBC: 10.5 10*3/uL (ref 4.0–10.5)
nRBC: 0 % (ref 0.0–0.2)

## 2019-06-20 LAB — DIFFERENTIAL
Abs Immature Granulocytes: 0.08 10*3/uL — ABNORMAL HIGH (ref 0.00–0.07)
Basophils Absolute: 0.1 10*3/uL (ref 0.0–0.1)
Basophils Relative: 1 %
Eosinophils Absolute: 0.4 10*3/uL (ref 0.0–0.5)
Eosinophils Relative: 3 %
Immature Granulocytes: 1 %
Lymphocytes Relative: 12 %
Lymphs Abs: 1.2 10*3/uL (ref 0.7–4.0)
Monocytes Absolute: 1.2 10*3/uL — ABNORMAL HIGH (ref 0.1–1.0)
Monocytes Relative: 12 %
Neutro Abs: 7.6 10*3/uL (ref 1.7–7.7)
Neutrophils Relative %: 71 %

## 2019-06-20 LAB — MAGNESIUM: Magnesium: 1.9 mg/dL (ref 1.7–2.4)

## 2019-06-20 LAB — PREALBUMIN: Prealbumin: 10.6 mg/dL — ABNORMAL LOW (ref 18–38)

## 2019-06-20 LAB — TRIGLYCERIDES: Triglycerides: 68 mg/dL (ref <150)

## 2019-06-20 LAB — GLUCOSE, CAPILLARY
Glucose-Capillary: 118 mg/dL — ABNORMAL HIGH (ref 70–99)
Glucose-Capillary: 130 mg/dL — ABNORMAL HIGH (ref 70–99)
Glucose-Capillary: 118 mg/dL — ABNORMAL HIGH (ref 70–99)

## 2019-06-20 MED ORDER — ENSURE ENLIVE PO LIQD
237.0000 mL | Freq: Two times a day (BID) | ORAL | Status: DC
  Administered 2019-06-20 – 2019-06-23 (×4): 237 mL via ORAL

## 2019-06-20 MED ORDER — OXYCODONE HCL 5 MG PO TABS
5.0000 mg | ORAL_TABLET | ORAL | Status: DC | PRN
  Administered 2019-06-22: 5 mg via ORAL
  Filled 2019-06-20: qty 1

## 2019-06-20 MED ORDER — TRAVASOL 10 % IV SOLN
INTRAVENOUS | Status: AC
  Administered 2019-06-20: 18:00:00 via INTRAVENOUS
  Filled 2019-06-20: qty 1056

## 2019-06-20 MED ORDER — ACETAMINOPHEN 500 MG PO TABS
1000.0000 mg | ORAL_TABLET | Freq: Four times a day (QID) | ORAL | Status: DC
  Administered 2019-06-20 – 2019-06-23 (×9): 1000 mg via ORAL
  Filled 2019-06-20 (×10): qty 2

## 2019-06-20 MED ORDER — METHOCARBAMOL 500 MG PO TABS
500.0000 mg | ORAL_TABLET | Freq: Four times a day (QID) | ORAL | Status: DC
  Administered 2019-06-21: 500 mg via ORAL
  Filled 2019-06-20 (×2): qty 1

## 2019-06-20 MED ORDER — MORPHINE SULFATE (PF) 2 MG/ML IV SOLN: 1.0000 mg | INTRAVENOUS | Status: DC | PRN

## 2019-06-20 NOTE — Progress Notes (Signed)
[facility]  PROGRESS NOTE  Kaela Beitz QPY:[id number] DOB: 1956/06/19 DOA: 06/10/2019 PCP: Patient, No Pcp Per  Brief History    Lezly Rumpf is a 63 y.o. year old female with no significant medical history  who presented on 06/10/2019 with nausea, vomiting, abdominal pain and unexplained significant weight loss over several weeks.  She presented to ED after outpatient evaluation for umbilical hernia based on her symptoms was sent to the ED, patient was found to have colonic wall thickening highly suspicious for primary colonic malignancy and concern for mesenteric implant causing small bowel obstruction.  Hospital course:  CT abdomen (06/11/2019)-acute small bowel obstruction, colonic wall thickening in transverse colon suspicious for primary colonic malignancy, abdominal pelvic ascites consistent with omental caking, small obstruction may be due to mesenteric implant, multiple hypodense liver lesions suspicious for metastatic disease  Abdominal x-ray (06/12/2019) multiple dilated loops of small bowel in central abdomen compatible with persistent partial small bowel obstruction  Patient underwent exploratory laparotomy with small bowel resection and primary anastomosis/partial colectomy with colostomy placed on 9/14.  Biopsy results pending, operative findings include carcinomatosis, liver metastasis, malignant small bowel obstruction, large primary tumor in proximal transverse colon  Due to poor nutritional status, prealbumin levels less than 5 patient was started on TPN postoperatively.  Hospital course currently consist of advancing diet  Dr. 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  Surgical pathology still pending.  Patient underwent CT chest with no evidence of metastatic disease on 9/19.  Plan for palliative treatment is metastatic disease is unresectable   A & P     Malignant small bowel obstruction, due to persistent SBO underwent partial colectomy with colostomy and small bowel resection on 9/14.  Given recent abdominal surgery patient started on TPN on 9/15 for nutritional support until bowel function returns.  NG tube removed, able to eat some of soft diet with minimal nausea but not enough to sustain nutritionally, continue TPN, IV antiemetics.  Will need to continue TPN until able to tolerate adequate oral diet, surgery consultants following,  Judicious use of pain control as patient became very somnolent with PCA pump earlier in hospitalization, currently on IV morphine will closely monitor respiratory status/mental status ,   switch to oral PRN Tylenol, scheduled oral Robaxin for pain control  Pt recs HHPT and supervision or SNF if family not able to provide   Transaminitis and elevated alk phos, acute.  Previous baseline within normal limits, currently AST 139, ALT 177, alk phos 323 total bili normal.  Like related to TPN, will need to closely monitor.  No worsening abdominal pain, repeat CMP in a.m.   Transverse colon carcinoma with omental metastasis, peritoneal carcinomatosis, liver metastasis.  Operative findings with large primary tumor in proximal transverse colon.  Pathology pending, CT chest imaging with no evidence of metastatic disease, oncology plans for close outpatient follow-up.  Patient states she would like to follow-up at [facility] as her daughter lives in [place] for further care   Hypokalemia, resolved.  Received repletion IV, closely monitor while on TPN   Hypophosphatemia resolved.  Received repletion IV, closely monitor while on TPN   Hyponatremia, resolved. Currently 135.  continue to closely monitor.   Unintentional weight loss, likely secondary to malignancy.  Prealbumin less than 5, TPN for nutritional support, dietary consulted   Normocytic anemia hemoglobin  stable . likely related to suspected GI malignancy.   Closely  monitor, no active signs or symptoms of bleeding.     DVT prophylaxis: lovenox Code Status: Full Family Communication: No family at bedside Disposition Plan: Needs continued inpatient stay given need for TPN while awaiting bowel function return and ability to tolerate oral diet, close monitoring of electrolytes/nutrients requiring IV repletion and     [facility] Direct contact: see [email or web address] (further directions at bottom of note if needed) 7PM-7AM contact night coverage as at bottom of note 06/20/2019, 4:17 PM  LOS: 9 days   Consultants  . General Surgery, oncology  Procedures  partial colectomy with colostomy and small bowel resection on 9/14  Antibiotics  . Ertapenem, 9/14  Interval History/Subjective  No acute events overnight Having increased ostomy output Eating a little bit of soft diet with some residual nausea no overt vomiting   Objective   Vitals:  Vitals:   06/20/19 0604 06/20/19 1338  BP: 131/60 (!) 115/58  Pulse: 91 93  Resp: 16 15  Temp: 98.8 F (37.1 C) 98.7 F (37.1 C)  SpO2: 97% 98%    Exam:  Awake Alert, Oriented X 3, No new F.N deficits, Normal affect [place].AT Abdomen soft, appropriately tender, decreased bowel sounds, staples in place incision site of central abdomen, colostomy bag in place with brown liquid stool output    I have personally reviewed the following:   Data Reviewed: Basic Metabolic Panel: Recent Labs  Lab 06/14/19 0251 06/15/19 0418 06/16/19 0414 06/17/19 0356 06/18/19 0411 06/19/19 0611 06/20/19 0530  NA 135 131* 132* 131* 130* 135 132*  K 4.2 3.6 4.0 4.2 4.0 4.3 4.1  CL 101 100 98 100 98 102 99  CO2 28 24 28 23 24 25 24   GLUCOSE 181* 115* 135* 134* 139* 109* 115*  BUN <5* 8 9 10 12 15 14   CREATININE 0.33* 0.33* 0.33* <0.30* <0.30* <0.30* <0.30*  CALCIUM 7.9* 7.8* 7.6* 7.8* 8.0* 8.5* 8.3*  MG 1.8 1.9 2.3 2.2  --   --  1.9  PHOS 2.6 2.3* 2.6 2.5  --   --  3.8   Liver Function Tests: Recent  Labs  Lab 06/14/19 0251 06/15/19 0418 06/16/19 0414 06/20/19 0530  AST 14* 15 14* 139*  ALT 11 11 11  177*  ALKPHOS 75 72 83 323*  BILITOT 0.4 0.3 0.4 0.4  PROT 5.5* 4.7* 5.3* 5.5*  ALBUMIN 2.6* 2.1* 2.3* 2.2*   No results for input(s): LIPASE, AMYLASE in the last 168 hours. No results for input(s): AMMONIA in the last 168 hours. CBC: Recent Labs  Lab 06/14/19 0251 06/15/19 0418 06/20/19 0530  WBC 11.2* 11.5* 10.5  NEUTROABS 9.1* 9.3* 7.6  HGB 11.5* 10.5* 9.1*  HCT 36.3 33.0* 28.4*  MCV 85.0 85.9 84.0  PLT 313 316 350   Cardiac Enzymes: No results for input(s): CKTOTAL, CKMB, CKMBINDEX, TROPONINI in the last 168 hours. BNP (last 3 results) No results for input(s): BNP in the last 8760 hours.  ProBNP (last 3 results) No results for input(s): PROBNP in the last 8760 hours.  CBG: Recent Labs  Lab 06/19/19 0556 06/19/19 1409 06/19/19 2145 06/20/19 0601 06/20/19 1349  GLUCAP 124* 120* 110* 118* 130*    Recent Results (from the past 240 hour(s))  SARS Coronavirus 2 [facility] order, Performed in [facility] hospital lab) Nasopharyngeal Nasopharyngeal Swab     Status: None   Collection Time: 06/11/19  1:02 AM   Specimen: Nasopharyngeal Swab  Result Value Ref Range Status   SARS Coronavirus 2 NEGATIVE NEGATIVE Final  Comment: (NOTE) If result is NEGATIVE SARS-CoV-2 target nucleic acids are NOT DETECTED. The SARS-CoV-2 RNA is generally detectable in upper and lower  respiratory specimens during the acute phase of infection. The lowest  concentration of SARS-CoV-2 viral copies this assay can detect is 250  copies / mL. A negative result does not preclude SARS-CoV-2 infection  and should not be used as the sole basis for treatment or other  patient management decisions.  A negative result may occur with  improper specimen collection / handling, submission of specimen other  than nasopharyngeal swab, presence of viral mutation(s) within the  areas targeted by this  assay, and inadequate number of viral copies  (<250 copies / mL). A negative result must be combined with clinical  observations, patient history, and epidemiological information. If result is POSITIVE SARS-CoV-2 target nucleic acids are DETECTED. The SARS-CoV-2 RNA is generally detectable in upper and lower  respiratory specimens dur ing the acute phase of infection.  Positive  results are indicative of active infection with SARS-CoV-2.  Clinical  correlation with patient history and other diagnostic information is  necessary to determine patient infection status.  Positive results do  not rule out bacterial infection or co-infection with other viruses. If result is PRESUMPTIVE POSTIVE SARS-CoV-2 nucleic acids MAY BE PRESENT.   A presumptive positive result was obtained on the submitted specimen  and confirmed on repeat testing.  While 2019 novel coronavirus  (SARS-CoV-2) nucleic acids may be present in the submitted sample  additional confirmatory testing may be necessary for epidemiological  and / or clinical management purposes  to differentiate between  SARS-CoV-2 and other Sarbecovirus currently known to infect humans.  If clinically indicated additional testing with an alternate test  methodology [phone]) is advised. The SARS-CoV-2 RNA is generally  detectable in upper and lower respiratory sp ecimens during the acute  phase of infection. The expected result is Negative. Fact Sheet for Patients:  StrictlyIdeas.no Fact Sheet for Healthcare Providers: BankingDealers.co.za This test is not yet approved or cleared by the [place] FDA and has been authorized for detection and/or diagnosis of SARS-CoV-2 by FDA under an Emergency Use Authorization (EUA).  This EUA will remain in effect (meaning this test can be used) for the duration of the COVID-19 declaration under Section 564(b)(1) of the Act, 21 U.S.C. section 360bbb-3(b)(1),  unless the authorization is terminated or revoked sooner. Performed at [facility], [place] [address], [place], [place] [address]      Studies: No results found.  Scheduled Meds: . acetaminophen  1,000 mg Oral Q6H  . Chlorhexidine Gluconate Cloth  6 each Topical Daily  . enoxaparin (LOVENOX) injection  40 mg Subcutaneous Q24H  . feeding supplement (ENSURE ENLIVE)  237 mL Oral BID BM  . insulin aspart  0-15 Units Subcutaneous Q8H  . methocarbamol  500 mg Oral QID  . metoCLOPramide  10 mg Oral TID AC  . multivitamin with minerals  1 tablet Oral Daily  . sodium chloride flush  10-40 mL Intracatheter Q12H   Continuous Infusions: . sodium chloride 20 mL/hr at 06/20/19 0600  . TPN ADULT (ION) 80 mL/hr at 06/20/19 0600  . TPN ADULT (ION)      Principal Problem:   SBO (small bowel obstruction) (HCC) Active Problems:   Hypokalemia   Weight loss   Suspected Colon cancer metastasized to liver (HCC)   Hypophosphatemia   Normocytic anemia   Protein-calorie malnutrition, severe ([place])      Dyllon Henken D Stanlee Roehrig  [facility]

## 2019-06-20 NOTE — Telephone Encounter (Signed)
A hospital follow up appointment has been scheduled for Ellen Larson to see Dr. Irene Limbo on 10/12 at 11am. Letter mailed to the pt.

## 2019-06-20 NOTE — Consult Note (Signed)
[facility] Nurse ostomy follow up Stoma type/location: RMQ, ascending coloscopy  Stomal assessment/size: 1 1/2" round, budded, pink, some mucosal sloughing Peristomal assessment: intact  Treatment options for stomal/peristomal skin: 2" barrier ring Output liquid green Ostomy pouching: 2pc. 2 1/4", may need larger barrier, cut to the very edge of the largest opening of this barrier  Education provided:   Patient performed pouch change (cutting new barrier, cleaning peristomal skin and stoma, use of barrier ring) Patient is independent in the use of a wick to clean the spout  Education on emptying when 1/3 to 1/2 full and how to empty Educated patient on "burping" gas from her pouch Provided patient with [employer] and marked items currently using.   She will not use HHRN per patient.    Enrolled patient in [facility] Discharge program: Yes, add clamp closure to her SS request.

## 2019-06-20 NOTE — Progress Notes (Signed)
PT Cancellation Note  Patient Details Name: Ellen Larson MRN: [id number] DOB: [birth year]   Cancelled Treatment:     pt just got back to bed from bathroom and requested to rest.  Reports she has been getting up with the staff "without a problem"  .  Feeling better but "slow" process.  Will cont to follow.    Rica Koyanagi  PTA Acute  Rehabilitation Services Pager      [phone] Office      [phone]

## 2019-06-20 NOTE — Progress Notes (Addendum)
[facility] NOTE   Pharmacy Consult for TPN Indication: anticipated prolonged PO intolerance  Patient Measurements: Height: 5' 3" (160 cm) Weight: 114 lb 3.2 oz (51.8 kg)(standing scale) IBW/kg (Calculated) : 52.4 TPN AdjBW (KG): 49 Body mass index is 20.23 kg/m. Usual Weight: 58 kg  HPI: 41 yoF with PMH renal disease presents 9/11 with n/v and abdominal pain along with 20 lbs weight loss. Has not seen MD in > 20 yrs. CT reveals SBO d/t transverse colon mass; also noted to have omental and liver metastases. On 9/14 underwent ex lap with resection of ileum, partial colectomy, and ascending colostomy. Pharmacy consulted to start TPN.   Central access: PICC placed 9/14 TPN start date: 9/15  Insulin Requirements: 0 units SSI in past 24 hours  Significant events:  - 9/14: to OR for primary procedure (see HPI) - 9/17: advancing to clears, remove NGT - 9/20: advancing to soft diet, add reglan  ASSESSMENT                                                                                                           Today, 06/20/2019:  Glucose (CBG goal 100-150)-well controlled   No Hx DM  Electrolytes - Na decreased to 132, Mag on lower end of goal range at 1.9. All others, including CorrCa, WNL.   Renal - SCr remains low. BUN WNL.   LFTs - significant increase in AST (14 > 139), ALT (11 > 177), Alk Phos (83 > 323). Tbili WNL.   TGs - 81 (9/16), 68 (9/21)  Prealbumin - < 5 (9/16), 10.6 (9/21)  Current Nutrition - soft diet. Ensure Enlive BID added today. TPN.   IVF - NS at 20 ml/hr  NUTRITIONAL GOALS                                                                                             RD recs (updated 9/16 d/t cancer and recent surgery): Kcal:  1900-2100 kcal Protein:  95-105 grams Fluid:  >/= 1.8 L/day   PLAN  At 1800 today:  Continue TPN at 80 ml/hr  TPN at goal rate of 80 ml/hr provides 105.6 g of protein and 1912 kcals, meeting 100% of patient needs. Due to significant rise in LFTs, decreased glucose content of TPN and subsequently increased protein content to deliver goal Kcal. Discussed with CCS today.   Continue PO MVI with minerals-if unable to tolerate today, will add back to TPN tomorrow.   Electrolytes: increase Na to 130 mEq/L, increase Mag to 7.5 mEq/L. Continue increased Ca at 6 mEq/L, increased Phos at 20 mmol/L, and standard K+. Cl:Ac = max Cl  Continue IVF at 20 ml/hr per MD  Continue moderate scale SSI; q8h CBG checks  TPN lab panels on Mondays & Thursdays  CMET, Mag, Phos in AM. Monitor LFTs closely.   Per CCS: to continue TPN until able to adequately tolerate PO diet  Lindell Spar, PharmD, BCPS Clinical Pharmacist  06/20/2019 11:49 AM

## 2019-06-20 NOTE — Progress Notes (Signed)
Patient ID: Ellen Larson, female   DOB: 1956-04-12, 63 y.o.   MRN: [id number]    7 Days Post-Op  Subjective: Patient isn't eating much.  Ate half a pancake today and said that is the most she has eaten so far.  Pain seems controlled.  Objective: Vital signs in last 24 hours: Temp:  [98.7 F (37.1 C)-99.1 F (37.3 C)] 98.8 F (37.1 C) (09/21 0604) Pulse Rate:  [91-101] 91 (09/21 0604) Resp:  [16] 16 (09/21 0604) BP: (122-134)/(57-68) 131/60 (09/21 0604) SpO2:  [97 %-99 %] 97 % (09/21 0604) Last BM Date: 06/20/19  Intake/Output from previous day: 09/20 0701 - 09/21 0700 In: 2677.3 [P.O.:720; I.V.:1957.3] Out: 200 [Stool:200] Intake/Output this shift: Total I/O In: 120 [P.O.:120] Out: -   PE: Abd: soft, midline incision is c/d/i with staples present.  Ostomy present with some liquid stool output and air.  Stoma is dark but viable  Lab Results:  Recent Labs    06/20/19 0530  WBC 10.5  HGB 9.1*  HCT 28.4*  PLT 350   BMET Recent Labs    06/19/19 0611 06/20/19 0530  NA 135 132*  K 4.3 4.1  CL 102 99  CO2 25 24  GLUCOSE 109* 115*  BUN 15 14  CREATININE <0.30* <0.30*  CALCIUM 8.5* 8.3*   PT/INR No results for input(s): LABPROT, INR in the last 72 hours. CMP     Component Value Date/Time   NA 132 (L) 06/20/2019 0530   K 4.1 06/20/2019 0530   CL 99 06/20/2019 0530   CO2 24 06/20/2019 0530   GLUCOSE 115 (H) 06/20/2019 0530   BUN 14 06/20/2019 0530   CREATININE <0.30 (L) 06/20/2019 0530   CALCIUM 8.3 (L) 06/20/2019 0530   PROT 5.5 (L) 06/20/2019 0530   ALBUMIN 2.2 (L) 06/20/2019 0530   AST 139 (H) 06/20/2019 0530   ALT 177 (H) 06/20/2019 0530   ALKPHOS 323 (H) 06/20/2019 0530   BILITOT 0.4 06/20/2019 0530   GFRNONAA NOT CALCULATED 06/20/2019 0530   GFRAA NOT CALCULATED 06/20/2019 0530   Lipase     Component Value Date/Time   LIPASE 22 06/10/2019 1248       Studies/Results: No results found.  Anti-infectives: Anti-infectives (From  admission, onward)   Start     Dose/Rate Route Frequency Ordered Stop   06/13/19 0933  ertapenem (INVANZ) 1,000 mg in sodium chloride 0.9 % 100 mL IVPB     1 g 200 mL/hr over 30 Minutes Intravenous 30 min pre-op 06/13/19 0930 06/13/19 1042       Assessment/Plan Malnutrition - prealbumin 10.6, continue TNA at this point due to minimal oral intake.  Add ensure today  POD 7, S/pExploratory laparotomy, small bowel resection with primary anastomosis, partial colectomy with ascending colostomy9/14 Dr. Harlow Asa for Transverse colon carcinoma with omental metastasis, peritoneal carcinomatosis, liver metastasis, malignant small bowel obstruction -surgical path confirmed above suspicion with +LNs -will need oncology to see her for further treatment -add ensure -cont WOC teaching for ostomy care -mobilize -pulm toilet, IS -cont to try and control nausea. This may end up being a chronic problem for her given her disease -add oral agents for pain control -discussed pathology with the patient.  ID -invanz periop. FEN -IVF, TPN,  zofran, compazine, and reglan, advance to soft diet VTE -SCDs, lovenox Foley -d/c 9/16 Follow up -Dr. Harlow Asa   LOS: 9 days    Henreitta Cea , [facility] Surgery 06/20/2019, 11:55 AM Pager: [phone]

## 2019-06-21 DIAGNOSIS — R740 Nonspecific elevation of levels of transaminase and lactic acid dehydrogenase [LDH]: Secondary | ICD-10-CM

## 2019-06-21 LAB — GLUCOSE, CAPILLARY
Glucose-Capillary: 123 mg/dL — ABNORMAL HIGH (ref 70–99)
Glucose-Capillary: 117 mg/dL — ABNORMAL HIGH (ref 70–99)
Glucose-Capillary: 109 mg/dL — ABNORMAL HIGH (ref 70–99)

## 2019-06-21 LAB — COMPREHENSIVE METABOLIC PANEL
ALT: 221 U/L — ABNORMAL HIGH (ref 0–44)
AST: 135 U/L — ABNORMAL HIGH (ref 15–41)
Albumin: 2.2 g/dL — ABNORMAL LOW (ref 3.5–5.0)
Alkaline Phosphatase: 302 U/L — ABNORMAL HIGH (ref 38–126)
Anion gap: 7 (ref 5–15)
BUN: 14 mg/dL (ref 8–23)
CO2: 23 mmol/L (ref 22–32)
Calcium: 8.2 mg/dL — ABNORMAL LOW (ref 8.9–10.3)
Chloride: 103 mmol/L (ref 98–111)
Creatinine, Ser: <0.3 mg/dL — ABNORMAL LOW (ref 0.44–1.00)
Glucose, Bld: 128 mg/dL — ABNORMAL HIGH (ref 70–99)
Potassium: 4.3 mmol/L (ref 3.5–5.1)
Sodium: 133 mmol/L — ABNORMAL LOW (ref 135–145)
Total Bilirubin: 0.3 mg/dL (ref 0.3–1.2)
Total Protein: 5.5 g/dL — ABNORMAL LOW (ref 6.5–8.1)

## 2019-06-21 LAB — PHOSPHORUS: Phosphorus: 3.7 mg/dL (ref 2.5–4.6)

## 2019-06-21 LAB — MAGNESIUM: Magnesium: 2.1 mg/dL (ref 1.7–2.4)

## 2019-06-21 MED ORDER — TRAVASOL 10 % IV SOLN
INTRAVENOUS | Status: AC
  Administered 2019-06-21: 17:00:00 via INTRAVENOUS
  Filled 2019-06-21: qty 528

## 2019-06-21 NOTE — Progress Notes (Signed)
Patient ID: Ellen Larson, female   DOB: 04/18/1956, 63 y.o.   MRN: [id number]    8 Days Post-Op  Subjective: Patient feels and looks much better today.  Nausea is significantly better today.  Ate a bowl of cereal and a banana for breakfast.  Objective: Vital signs in last 24 hours: Temp:  [98.4 F (36.9 C)-99.3 F (37.4 C)] 98.4 F (36.9 C) (09/22 0620) Pulse Rate:  [92-93] 92 (09/22 0620) Resp:  [15-16] 16 (09/22 0620) BP: (115-149)/(58-67) 127/67 (09/22 0620) SpO2:  [98 %-99 %] 99 % (09/22 0620) Last BM Date: 06/21/19(ostomy)  Intake/Output from previous day: 09/21 0701 - 09/22 0700 In: 3075.8 [P.O.:780; I.V.:2295.8] Out: 0  Intake/Output this shift: Total I/O In: 319.9 [I.V.:319.9] Out: -   PE: Abd: soft, minimally tender, midline incision is c/d/i with staples present, ostomy working well with feculent liquid output.  Lab Results:  Recent Labs    06/20/19 0530  WBC 10.5  HGB 9.1*  HCT 28.4*  PLT 350   BMET Recent Labs    06/20/19 0530 06/21/19 0507  NA 132* 133*  K 4.1 4.3  CL 99 103  CO2 24 23  GLUCOSE 115* 128*  BUN 14 14  CREATININE <0.30* <0.30*  CALCIUM 8.3* 8.2*   PT/INR No results for input(s): LABPROT, INR in the last 72 hours. CMP     Component Value Date/Time   NA 133 (L) 06/21/2019 0507   K 4.3 06/21/2019 0507   CL 103 06/21/2019 0507   CO2 23 06/21/2019 0507   GLUCOSE 128 (H) 06/21/2019 0507   BUN 14 06/21/2019 0507   CREATININE <0.30 (L) 06/21/2019 0507   CALCIUM 8.2 (L) 06/21/2019 0507   PROT 5.5 (L) 06/21/2019 0507   ALBUMIN 2.2 (L) 06/21/2019 0507   AST 135 (H) 06/21/2019 0507   ALT 221 (H) 06/21/2019 0507   ALKPHOS 302 (H) 06/21/2019 0507   BILITOT 0.3 06/21/2019 0507   GFRNONAA NOT CALCULATED 06/21/2019 0507   GFRAA NOT CALCULATED 06/21/2019 0507   Lipase     Component Value Date/Time   LIPASE 22 06/10/2019 1248       Studies/Results: No results found.  Anti-infectives: Anti-infectives (From admission,  onward)   Start     Dose/Rate Route Frequency Ordered Stop   06/13/19 0933  ertapenem (INVANZ) 1,000 mg in sodium chloride 0.9 % 100 mL IVPB     1 g 200 mL/hr over 30 Minutes Intravenous 30 min pre-op 06/13/19 0930 06/13/19 1042       Assessment/Plan Malnutrition - prealbumin 10.6, half rate TNA today, likely wean to off tomorrow.  Cont ensure  POD 8, S/pExploratory laparotomy, small bowel resection with primary anastomosis, partial colectomy with ascending colostomy9/14 Dr. Harlow Asa for Transverse colon carcinoma with omental metastasis, peritoneal carcinomatosis, liver metastasis, malignant small bowel obstruction -surgical path confirmed above suspicion with +LNs -will need oncology to see her for further treatment, she is following up with [facility] -ensure -cont WOC teaching for ostomy care -mobilize -pulm toilet, IS -nausea much better, pain controlled with just tylenol -oral agents for pain control   ID -invanz periop. FEN -IVF, TPN (1/2 rate today), soft diet VTE -SCDs, lovenox Foley -d/c 9/16 Follow up -Dr. Harlow Asa   LOS: 10 days    Henreitta Cea , [facility] Surgery 06/21/2019, 10:25 AM Pager: [phone]

## 2019-06-21 NOTE — Progress Notes (Signed)
Nutrition Follow-up  RD working remotely.   DOCUMENTATION CODES:   Not applicable  INTERVENTION:  - continue Ensure Enlive po BID, each supplement provides 350 kcal and 20 grams of protein - TPN weaning per Pharmacy and Surgery.  - continue to encourage PO intakes.    NUTRITION DIAGNOSIS:   Inadequate oral intake related to inability to eat as evidenced by NPO status. -diet advanced, intakes slowly improving.   GOAL:   Patient will meet greater than or equal to 90% of their needs -met at this time  MONITOR:   PO intake, Supplement acceptance, Labs, Weight trends, Other (Comment)(TPN regimen)  ASSESSMENT:   63 y.o. female with no significant past medical history mainly previous renal disease. She presented to the ED on 9/11 with N/V, abdominal pain, and significant weight loss over the past 6 weeks. She reported symptoms have been acutely worse. Patient reported not seeing a doctor in over 20 years. In the ED she was found to have SBO and possible colon malignancy with mets. She has been mainly having loose stools, no formed stools, but not overt diarrhea. Surgery consulted.  Significant Events: 9/11- admission 9/14- ex lap, small bowel resection with primary anastomosis, partial colectomy with colostomy; double lumen PICC placed in R basilic; NGT placed in R nare 9/15- TPN initiation 9/17- diet advanced from NPO to CLD 9/20- diet advanced from CLD to Soft  Per flow sheet, patient consumed 100% of breakfast, 60% of lunch, and 30% of dinner on 9/18 (total of 530 kcal, 3 grams protein); 20% of dinner on 9/19 (54 kcal, 0.5 grams protein); 5% of lunch and 25% of dinner on 9/21 (total of 173 kcal, 10 grams protein); 100% of breakfast this AM (430 kcal, 21 grams protein).  Patient reports that she was able to eat cereal with milk and a banana and a few bites of scrambled egg for breakfast. She began sipping on Ensure shortly before RD visit. Ensure ordered BID yesterday and patient  drank a bottle last night.   She reports that nausea is nearly resolved. She denies any overt abdominal discomfort with intakes, stating that each day gets better. She sometimes walks around the room after intakes to aid in digestion and motility. Patient reports she was experiencing early satiety and previously feeling full after a few bites, but every day this improves and she is able to eat more before feeling full. She denies any nutrition-related questions, concerns, or needs at this time.   Per Kelly's note earlier this AM, plan to cut TPN to half rate today. Patient is currently receiving custom TPN @ 80 ml/hr.   Labs reviewed; CBG: 123 mg/dl today, Na: 133 mmol/l, creatinine: <0.3 mg/dl, Alk Phos elevated, LFTs elevated, triglycerides: 68 mg/dl on 9/21. Medications reviewed; sliding scale novolog, 10 mg oral reglan TID. IVF; NS @ 20 ml/hr.     NUTRITION - FOCUSED PHYSICAL EXAM:    Most Recent Value  Orbital Region  No depletion  Upper Arm Region  Mild depletion  Thoracic and Lumbar Region  Unable to assess  Buccal Region  No depletion  Temple Region  No depletion  Clavicle Bone Region  Mild depletion  Clavicle and Acromion Bone Region  Mild depletion  Scapular Bone Region  Mild depletion  Dorsal Hand  No depletion  Patellar Region  No depletion  Anterior Thigh Region  Unable to assess  Posterior Calf Region  Mild depletion  Edema (RD Assessment)  None  Hair  Reviewed  Eyes  Reviewed  Mouth  Reviewed  Skin  Reviewed  Nails  Reviewed       Diet Order:   Diet Order            DIET SOFT Room service appropriate? Yes; Fluid consistency: Thin  Diet effective now              EDUCATION NEEDS:   Not appropriate for education at this time  Skin:  Skin Assessment: Skin Integrity Issues: Skin Integrity Issues:: Incisions Incisions: abdomen (9/14)  Last BM:  9/22  Height:   Ht Readings from Last 1 Encounters:  06/10/19 5' 3" (1.6 m)    Weight:   Wt  Readings from Last 1 Encounters:  06/15/19 51.8 kg    Ideal Body Weight:  52.3 kg  BMI:  Body mass index is 20.23 kg/m.  Estimated Nutritional Needs:   Kcal:  1900-2100 kcal  Protein:  95-105 grams  Fluid:  >/= 1.8 L/day     Jarome Matin, MS, RD, LDN, [facility] Inpatient Clinical Dietitian Pager # [phone] After hours/weekend pager # [phone]

## 2019-06-21 NOTE — Plan of Care (Signed)
Continue with current POC

## 2019-06-21 NOTE — Progress Notes (Signed)
[place] NOTE   Pharmacy Consult for TPN Indication: anticipated prolonged PO intolerance  Patient Measurements: Height: 5' 3"  (160 cm) Weight: 114 lb 3.2 oz (51.8 kg) IBW/kg (Calculated) : 52.4 TPN AdjBW (KG): 49 Body mass index is 20.23 kg/m. Usual Weight: 58 kg  HPI: 62 yoF with PMH renal disease presents 9/11 with n/v and abdominal pain along with 20 lbs weight loss. Has not seen MD in > 20 yrs. CT reveals SBO d/t transverse colon mass; also noted to have omental and liver metastases. On 9/14 underwent ex lap with resection of ileum, partial colectomy, and ascending colostomy. Pharmacy consulted to start TPN.   Central access: PICC placed 9/14 TPN start date: 9/15  Insulin Requirements: 4 units SSI in past 24 hours  Significant events:  - 9/14: to OR for primary procedure (see HPI) - 9/17: advancing to clears, remove NGT - 9/20: advancing to soft diet, add reglan - 9/22: decrease TPN to 1/2 rate per CCS  ASSESSMENT                                                                                                           Today, 06/21/2019:  Glucose (CBG goal 100-150)-well controlled   No Hx DM  Electrolytes - Na improved but still slightly low at 133. All others, including CorrCa, WNL.   Renal - SCr remains low. BUN WNL.   LFTs - significant increase LFTs since 9/17: AST (14 > 139 > 135), ALT (11 > 177 > 221), Alk Phos (83 > 323 > 302). Tbili WNL.   TGs - 81 (9/16), 68 (9/21)  Prealbumin - < 5 (9/16), 10.6 (9/21)  Current Nutrition - soft diet, Ensure Enlive BID, TPN   IVF - NS at 20 ml/hr  NUTRITIONAL GOALS                                                                                             RD recs (updated 9/16 d/t cancer and recent surgery): Kcal:  1900-2100 kcal Protein:  95-105 grams Fluid:  >/= 1.8 L/day   PLAN  At 1800 today:  Decrease TPN to 40 ml/hr  Continue PO MVI with minerals.   Electrolytes: increase Na to 140 mEq/L. Continue increased Mag at 7.5 mEq/L, increased Ca at 6 mEq/L, increased Phos at 20 mmol/L, and standard K+. Cl:Ac = max Cl  Continue IVF at 20 ml/hr per MD  Continue moderate scale SSI; q8h CBG checks  TPN lab panels on Mondays & Thursdays  CMET, Mag, Phos in AM. Monitor LFTs closely.   F/u PO diet intake for ability to continue to wean/discontinue TPN.   Lindell Spar, PharmD, BCPS Clinical Pharmacist  06/21/2019 11:21 AM

## 2019-06-21 NOTE — Progress Notes (Signed)
[facility]  PROGRESS NOTE  Ceclia Koker OHY:[id number] DOB: 1956/01/02 DOA: 06/10/2019 PCP: Patient, No Pcp Per  Brief History    Ellen Larson is a 63 y.o. year old female with no significant medical history  who presented on 06/10/2019 with nausea, vomiting, abdominal pain and unexplained significant weight loss over several weeks.  She presented to ED after outpatient evaluation for umbilical hernia based on her symptoms was sent to the ED, patient was found to have colonic wall thickening highly suspicious for primary colonic malignancy and concern for mesenteric implant causing small bowel obstruction.  Hospital course:  CT abdomen (06/11/2019)-acute small bowel obstruction, colonic wall thickening in transverse colon suspicious for primary colonic malignancy, abdominal pelvic ascites consistent with omental caking, small obstruction may be due to mesenteric implant, multiple hypodense liver lesions suspicious for metastatic disease  Abdominal x-ray (06/12/2019) multiple dilated loops of small bowel in central abdomen compatible with persistent partial small bowel obstruction  Patient underwent exploratory laparotomy with small bowel resection and primary anastomosis/partial colectomy with colostomy placed on 9/14.  Biopsy results pending, operative findings include carcinomatosis, liver metastasis, malignant small bowel obstruction, large primary tumor in proximal transverse colon  Due to poor nutritional status, prealbumin levels less than 5 patient was started on TPN postoperatively.  Hospital course currently consist of advancing diet  Dr. 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  Surgical pathology still pending.  Patient underwent CT chest with no evidence of metastatic disease on 9/19.  Plan for palliative treatment is metastatic disease is unresectable   A & P     Malignant small bowel obstruction, due to persistent SBO underwent partial colectomy with colostomy and small bowel resection on 9/14.  Given recent abdominal surgery patient started on TPN on 9/15 for nutritional support until able to tolerate sufficient oral intake.  Was able to tolerate soft diet this morning) will decrease TPN rate to half given persistent LFTs, hopeful to be able to wean off in 24 hours.  Needs continued inpatient stay for IV nutrition.  Continue oral pain medicine control, antiemetics as needed. Pt recs HHPT and supervision or SNF if family not able to provide--patient wants to go to [facility] with daughter   Transaminitis and elevated alk phos, acute.  Previous baseline within normal limits, currently AST 139, ALT 177, alk phos 323 total bili normal.  Likelt related to TPN, reduce rate to half, hopeful to be able to wean in next 24 hours given improvement in oral intake will need to closely monitor.  No worsening abdominal pain, repeat CMP in a.m.   Transverse colon carcinoma with omental metastasis, peritoneal carcinomatosis, liver metastasis.  Operative findings with large primary tumor in proximal transverse colon.  Pathology confirms invasive colonic adenocarcinoma, CT chest imaging with no evidence of metastatic disease, oncology plans for close outpatient follow-up.  Patient states she would like to follow-up at [facility] as her daughter lives in [place] for further care   Hypokalemia, resolved.  Received repletion IV, closely monitor while on TPN   Hypophosphatemia resolved.  Received repletion IV, closely monitor while on TPN   Hyponatremia, resolved. Currently 135.  continue to closely monitor.   Unintentional weight loss, likely secondary to malignancy.  Prealbumin less than 5, TPN for nutritional support, dietary consulted   Normocytic anemia hemoglobin  stable . likely related to suspected GI malignancy.  Closely monitor, no active signs or symptoms  of  bleeding.     DVT prophylaxis: lovenox Code Status: Full Family Communication: No family at bedside Disposition Plan: Needs continued inpatient stay given need for TPN while awaiting ability to fully tolerate adequate oral diet   [facility] Direct contact: see [email or web address] (further directions at bottom of note if needed) 7PM-7AM contact night coverage as at bottom of note 06/21/2019, 1:16 PM  LOS: 10 days   Consultants  . General Surgery, oncology  Procedures  partial colectomy with colostomy and small bowel resection on 9/14 Biopsy: Diagnosis 1. Small intestine, resection for tumor, proximal ileum - METASTATIC CARCINOMA INVOLVING SMALL INTESTINE. - METASTATIC CARCINOMA IN (2) OF (3) LYMPH NODES. - (4) TUMOR DEPOSITS. 2. Colon, segmental resection for tumor, proximal transverse colon - INVASIVE COLONIC ADENOCARCINOMA, 5 CM. - TUMOR INVADES THE VISCERAL PERITONEUM. - MARGINS OF RESECTION ARE NOT INVOLVED. - METASTATIC CARCINOMA IN (4) OF (10) LYMPH NODES. - (6) TUMOR DEPOSITS. - SEE ONCOLOGY TABLE.  Antibiotics  . Ertapenem, 9/14  Interval History/Subjective  Was able to eat cereal and banana Some minimal nausea Minimal belly pain   Objective   Vitals:  Vitals:   06/20/19 2100 06/21/19 0620  BP: (!) 149/65 127/67  Pulse: 92 92  Resp: 16 16  Temp: 99.3 F (37.4 C) 98.4 F (36.9 C)  SpO2: 99% 99%    Exam:  Awake Alert, Oriented X 3, No new F.N deficits, Normal affect [place].AT Abdomen soft, appropriately tender, bowel sounds present, staples in place incision site of central abdomen, colostomy bag in place with brown liquid stool output    I have personally reviewed the following:   Data Reviewed: Basic Metabolic Panel: Recent Labs  Lab 06/15/19 0418 06/16/19 0414 06/17/19 0356 06/18/19 0411 06/19/19 0611 06/20/19 0530 06/21/19 0507  NA 131* 132* 131* 130* 135 132* 133*  K 3.6 4.0 4.2 4.0 4.3 4.1 4.3  CL 100 98 100 98 102 99 103  CO2 24 28  23 24 25 24 23   GLUCOSE 115* 135* 134* 139* 109* 115* 128*  BUN 8 9 10 12 15 14 14   CREATININE 0.33* 0.33* <0.30* <0.30* <0.30* <0.30* <0.30*  CALCIUM 7.8* 7.6* 7.8* 8.0* 8.5* 8.3* 8.2*  MG 1.9 2.3 2.2  --   --  1.9 2.1  PHOS 2.3* 2.6 2.5  --   --  3.8 3.7   Liver Function Tests: Recent Labs  Lab 06/15/19 0418 06/16/19 0414 06/20/19 0530 06/21/19 0507  AST 15 14* 139* 135*  ALT 11 11 177* 221*  ALKPHOS 72 83 323* 302*  BILITOT 0.3 0.4 0.4 0.3  PROT 4.7* 5.3* 5.5* 5.5*  ALBUMIN 2.1* 2.3* 2.2* 2.2*   No results for input(s): LIPASE, AMYLASE in the last 168 hours. No results for input(s): AMMONIA in the last 168 hours. CBC: Recent Labs  Lab 06/15/19 0418 06/20/19 0530  WBC 11.5* 10.5  NEUTROABS 9.3* 7.6  HGB 10.5* 9.1*  HCT 33.0* 28.4*  MCV 85.9 84.0  PLT 316 350   Cardiac Enzymes: No results for input(s): CKTOTAL, CKMB, CKMBINDEX, TROPONINI in the last 168 hours. BNP (last 3 results) No results for input(s): BNP in the last 8760 hours.  ProBNP (last 3 results) No results for input(s): PROBNP in the last 8760 hours.  CBG: Recent Labs  Lab 06/19/19 2145 06/20/19 0601 06/20/19 1349 06/20/19 2103 06/21/19 0622  GLUCAP 110* 118* 130* 118* 123*    No results found for this or any previous visit (from the past 240 hour(s)).  Studies: No results found.  Scheduled Meds: . acetaminophen  1,000 mg Oral Q6H  . Chlorhexidine Gluconate Cloth  6 each Topical Daily  . enoxaparin (LOVENOX) injection  40 mg Subcutaneous Q24H  . feeding supplement (ENSURE ENLIVE)  237 mL Oral BID BM  . insulin aspart  0-15 Units Subcutaneous Q8H  . methocarbamol  500 mg Oral QID  . metoCLOPramide  10 mg Oral TID AC  . multivitamin with minerals  1 tablet Oral Daily  . sodium chloride flush  10-40 mL Intracatheter Q12H   Continuous Infusions: . sodium chloride 20 mL/hr at 06/21/19 0600  . TPN ADULT (ION) 80 mL/hr at 06/21/19 0600  . TPN ADULT (ION)      Principal Problem:    SBO (small bowel obstruction) (HCC) Active Problems:   Hypokalemia   Weight loss   Suspected Colon cancer metastasized to liver (HCC)   Hypophosphatemia   Normocytic anemia   Protein-calorie malnutrition, severe (HCC)   Transaminitis   Hyponatremia      Bevelyn Arriola D Josanne Boerema  [facility]

## 2019-06-22 LAB — PHOSPHORUS: Phosphorus: 3.5 mg/dL (ref 2.5–4.6)

## 2019-06-22 LAB — COMPREHENSIVE METABOLIC PANEL
ALT: 142 U/L — ABNORMAL HIGH (ref 0–44)
AST: 54 U/L — ABNORMAL HIGH (ref 15–41)
Albumin: 2.3 g/dL — ABNORMAL LOW (ref 3.5–5.0)
Alkaline Phosphatase: 259 U/L — ABNORMAL HIGH (ref 38–126)
Anion gap: 8 (ref 5–15)
BUN: 10 mg/dL (ref 8–23)
CO2: 23 mmol/L (ref 22–32)
Calcium: 8.3 mg/dL — ABNORMAL LOW (ref 8.9–10.3)
Chloride: 101 mmol/L (ref 98–111)
Creatinine, Ser: <0.3 mg/dL — ABNORMAL LOW (ref 0.44–1.00)
Glucose, Bld: 103 mg/dL — ABNORMAL HIGH (ref 70–99)
Potassium: 3.7 mmol/L (ref 3.5–5.1)
Sodium: 132 mmol/L — ABNORMAL LOW (ref 135–145)
Total Bilirubin: 0.2 mg/dL — ABNORMAL LOW (ref 0.3–1.2)
Total Protein: 5.7 g/dL — ABNORMAL LOW (ref 6.5–8.1)

## 2019-06-22 LAB — GLUCOSE, CAPILLARY
Glucose-Capillary: 110 mg/dL — ABNORMAL HIGH (ref 70–99)
Glucose-Capillary: 147 mg/dL — ABNORMAL HIGH (ref 70–99)
Glucose-Capillary: 99 mg/dL (ref 70–99)

## 2019-06-22 LAB — MAGNESIUM: Magnesium: 2.1 mg/dL (ref 1.7–2.4)

## 2019-06-22 NOTE — Progress Notes (Signed)
[place] NOTE   Pharmacy Consult for TPN Indication: anticipated prolonged PO intolerance  Patient Measurements: Height: _0  (160 cm) Weight: 114 lb 3.2 oz (51.8 kg) IBW/kg (Calculated) : 52.4 TPN AdjBW (KG): 49 Body mass index is 20.23 kg/m. Usual Weight: 58 kg  HPI: 43 yoF with PMH renal disease presents 9/11 with n/v and abdominal pain along with 20 lbs weight loss. Has not seen MD in > 20 yrs. CT reveals SBO d/t transverse colon mass; also noted to have omental and liver metastases. On 9/14 underwent ex lap with resection of ileum, partial colectomy, and ascending colostomy. Pharmacy consulted to start TPN.   Central access: PICC placed 9/14 TPN start date: 9/15  Insulin Requirements: 0 units SSI in past 24 hours  Significant events:  - 9/14: to OR for primary procedure (see HPI) - 9/17: advancing to clears, remove NGT - 9/20: advancing to soft diet, add reglan - 9/22: decrease TPN to 1/2 rate per CCS  ASSESSMENT                                                                                                           Today, 06/22/2019:  Glucose (CBG goal 100-150)-well controlled   No Hx DM  Electrolytes - Na still slightly low at 132. All others, including CorrCa, WNL.   Renal - SCr remains low. BUN WNL.   LFTs - AST/ALT, Alk Phos remain elevated, but trending down. Tbili low.    TGs - 81 (9/16), 68 (9/21)  Prealbumin - < 5 (9/16), 10.6 (9/21)  Current Nutrition - soft diet, Ensure Enlive BID, TPN   IVF - NS at 20 ml/hr  NUTRITIONAL GOALS                                                                                             RD recs (updated 9/16 d/t cancer and recent surgery): Kcal:  1900-2100 kcal Protein:  95-105 grams Fluid:  >/= 1.8 L/day   PLAN  Per CCS, stop TPN today after  current bag runs out.  D/C associated TPN labs and SSI.    Lindell Spar, PharmD, BCPS Clinical Pharmacist  06/22/2019 9:30 AM

## 2019-06-22 NOTE — Consult Note (Signed)
[place] Nurse ostomy follow up Stoma type/location: RMQ, ascending colostomy  Stomal assessment/size: 1 1/2" round, budded, pink, some mucosal sloughing  Peristomal assessment: NA Treatment options for stomal/peristomal skin: using 2" barrier ring  Output liquid brown Ostomy pouching: 2pc. 2 1/4"  Education provided:  Patient independent in care Provided supplies for DC, added 2 3/4" just in case she needs with the size of the stoma.  Enrolled patient in [place] Start Discharge program: Yes   Plans for DC tomorrow.   [place] Nurse will follow along with you for continued support with ostomy teaching and care [place] MSN, RN, [place], [place], [place]

## 2019-06-22 NOTE — Care Management Important Message (Signed)
Important Message  Patient Details IM Letter given to Kathrin Greathouse SW to present to the Patient Name: Ellen Larson MRN: [id number] Date of Birth: May 27, 1956   Medicare Important Message Given:  Yes     Kerin Salen 06/22/2019, 11:01 AM

## 2019-06-22 NOTE — Progress Notes (Signed)
Patient inquiring how to retreive medical records.  CSW reached out to the medical records and was informed of the process. CSW provided contact information and explain the process to the patient. Patient reports she has already received some of her records. She reports understanding the process and will follow up after discharge if there are additional records that she may need.

## 2019-06-22 NOTE — Progress Notes (Signed)
PROGRESS NOTE    Ellen Larson  [id number] DOB: November 09, 1955 DOA: 06/10/2019 PCP: Patient, No Pcp Per   Brief Narrative:  63 y.o. year old female with no significant medical history  who presented on 06/10/2019 with nausea, vomiting, abdominal pain and unexplained significant weight loss over several weeks.  She presented to ED after outpatient evaluation for umbilical hernia based on her symptoms was sent to the ED, patient was found to have colonic wall thickening highly suspicious for primary colonic malignancy and concern for mesenteric implant causing small bowel obstruction.  Hospital course:  CT abdomen (06/11/2019)-acute small bowel obstruction, colonic wall thickening in transverse colon suspicious for primary colonic malignancy, abdominal pelvic ascites consistent with omental caking, small obstruction may be due to mesenteric implant, multiple hypodense liver lesions suspicious for metastatic disease  Abdominal x-ray (06/12/2019) multiple dilated loops of small bowel in central abdomen compatible with persistent partial small bowel obstruction  Patient underwent exploratory laparotomy with small bowel resection and primary anastomosis/partial colectomy with colostomy placed on 9/14.  Biopsy results pending, operative findings include carcinomatosis, liver metastasis, malignant small bowel obstruction, large primary tumor in proximal transverse colon  Due to poor nutritional status, prealbumin levels less than 5 patient was started on TPN postoperatively.  Hospital course currently consist of advancing diet  Dr. 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  Surgical pathology still pending.  Patient underwent CT chest with no evidence of metastatic disease on 9/19.  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  Given recent abdominal surgery patient started on TPN on 9/15 for nutritional support until able to tolerate sufficient oral intake.  Was able to tolerate soft diet this morning) will decrease TPN rate to half given persistent LFTs, hopeful to be able to wean off in 24 hours.  Needs continued inpatient stay for IV nutrition.  Continue oral pain medicine control, antiemetics as needed. Pt recs HHPT and supervision or SNF if family not able to provide--patient wants to go to [facility] with daughter..TPN to be stopped today..hope to dc in am if tolerating po  Transaminitis and elevated alk phos, acute. improving.   Transverse colon carcinoma with omental metastasis, peritoneal carcinomatosis, liver metastasis.  Operative findings with large primary tumor in proximal transverse colon.  Pathology confirms invasive colonic adenocarcinoma, CT chest imaging with no evidence of metastatic disease, oncology plans for close outpatient follow-up.  Patient states she would like to follow-up at [facility] as her daughter lives in [place] for further care  Hypokalemia, resolved.  Received repletion IV, closely monitor while on TPN  Hypophosphatemia resolved.  Received repletion IV, closely monitor while on TPN  Hyponatremia, resolved. Currently 135.  continue to closely monitor.  Unintentional weight loss, likely secondary to malignancy.  Prealbumin less than 5, TPN for nutritional support, dietary consulted  Normocytic anemia hemoglobin  stable . likely related to suspected GI malignancy.  Closely monitor,  no active signs or symptoms of bleeding.  DVT prophylaxis: lovenox Code Status:  Full Family Communication: No family at bedside Disposition Plan:   Pending po intake    Nutrition Problem: Inadequate oral intake Etiology: inability to eat     Signs/Symptoms: NPO status    Interventions: TPN  Estimated body mass index is 20.23 kg/m as calculated from the following:   Height as of this encounter: _0  (1.6 m).   Weight as of this encounter: 51.8 kg.  Subjective: Tolerating po intake still on tpn wants to see onc at [facility]  Objective: Vitals:   06/21/19 2212 06/22/19 0600 06/22/19 0745 06/22/19 1329  BP: (!) 146/73 (!) 151/79 (!) 145/67 (!) 150/75  Pulse: 98 (!) 101 96 (!) 103  Resp: _1 Temp: 98.9 F (37.2 C) 98.6 F (37 C) 99.6 F (37.6 C) 98.6 F (37 C)  TempSrc: Oral Oral Oral   SpO2: 98% 98% 98% 97%  Weight:      Height:        Intake/Output Summary (Last 24 hours) at 06/22/2019 1409 Last data filed at 06/22/2019 1329 Gross per 24 hour  Intake 2179.97 ml  Output 50 ml  Net 2129.97 ml   Filed Weights   06/10/19 1208 06/15/19 1825 06/15/19 2100  Weight: 49 kg 56.7 kg 51.8 kg    Examination:  General exam: Appears calm and comfortable  Respiratory system: Clear to auscultation. Respiratory effort normal. Cardiovascular system: S1 & S2 heard, RRR. No JVD, murmurs, rubs, gallops or clicks. No pedal edema. Gastrointestinal system: Abdomen is nondistended, soft and nontender. No organomegaly or masses felt. Normal bowel sounds heard.staples clean dry intact Central nervous system: Alert and oriented. No focal neurological deficits. Extremities: Symmetric 5 x 5 power. Skin: No rashes, lesions or ulcers Psychiatry: Judgement and insight appear normal. Mood & affect appropriate.     Data Reviewed: I have personally reviewed following labs and imaging studies  CBC: Recent Labs  Lab 06/20/19 0530  WBC 10.5  NEUTROABS 7.6  HGB 9.1*  HCT  28.4*  MCV 84.0  PLT 559   Basic Metabolic Panel: Recent Labs  Lab 06/16/19 0414 06/17/19 0356 06/18/19 0411 06/19/19 0611 06/20/19 0530 06/21/19 0507 06/22/19 0418  NA 132* 131* 130* 135 132* 133* 132*  K 4.0 4.2 4.0 4.3 4.1 4.3 3.7  CL 98 100 98 102 99 103 101  CO2 _2 GLUCOSE 135* 134* 139* 109* 115* 128* 103*  BUN _3 CREATININE 0.33* <0.30* <0.30* <0.30* <0.30* <0.30* <0.30*  CALCIUM 7.6* 7.8* 8.0* 8.5* 8.3* 8.2* 8.3*  MG 2.3 2.2  --   --  1.9 2.1 2.1  PHOS 2.6 2.5  --   --  3.8 3.7 3.5   GFR: CrCl cannot be calculated (This lab value cannot be used to calculate CrCl because it is not a number: <0.30). Liver Function Tests: Recent Labs  Lab 06/16/19 0414 06/20/19 0530 06/21/19 0507 06/22/19 0418  AST 14* 139* 135* 54*  ALT 11 177* 221* 142*  ALKPHOS 83 323* 302* 259*  BILITOT 0.4 0.4 0.3 0.2*  PROT 5.3* 5.5* 5.5* 5.7*  ALBUMIN 2.3* 2.2* 2.2* 2.3*   No results for input(s): LIPASE, AMYLASE in the last 168 hours. No results for input(s): AMMONIA in the last 168 hours. Coagulation Profile: No results for input(s): INR, PROTIME in the last 168 hours. Cardiac Enzymes: No results for input(s): CKTOTAL, CKMB, CKMBINDEX, TROPONINI in the last 168 hours. BNP (last 3 results) No results  for input(s): PROBNP in the last 8760 hours. HbA1C: No results for input(s): HGBA1C in the last 72 hours. CBG: Recent Labs  Lab 06/20/19 2103 06/21/19 0622 06/21/19 1410 06/21/19 2214 06/22/19 0602  GLUCAP 118* 123* 117* 109* 110*   Lipid Profile: Recent Labs    06/20/19 0530  TRIG 68   Thyroid Function Tests: No results for input(s): TSH, T4TOTAL, FREET4, T3FREE, THYROIDAB in the last 72 hours. Anemia Panel: No results for input(s): VITAMINB12, FOLATE, FERRITIN, TIBC, IRON, RETICCTPCT in the last 72 hours. Sepsis Labs: No results for input(s): PROCALCITON, LATICACIDVEN in the last 168 hours.  No results found for this or any  previous visit (from the past 240 hour(s)).       Radiology Studies: No results found.      Scheduled Meds: . acetaminophen  1,000 mg Oral Q6H  . Chlorhexidine Gluconate Cloth  6 each Topical Daily  . enoxaparin (LOVENOX) injection  40 mg Subcutaneous Q24H  . feeding supplement (ENSURE ENLIVE)  237 mL Oral BID BM  . insulin aspart  0-15 Units Subcutaneous Q8H  . methocarbamol  500 mg Oral QID  . metoCLOPramide  10 mg Oral TID AC  . multivitamin with minerals  1 tablet Oral Daily  . sodium chloride flush  10-40 mL Intracatheter Q12H   Continuous Infusions: . sodium chloride 20 mL/hr at 06/21/19 1704  . TPN ADULT (ION) 40 mL/hr at 06/21/19 1726     LOS: 11 days      Georgette Shell, MD [facility]  If 7PM-7AM, please contact night-coverage [email or web address] Password [email or web address] 06/22/2019, 2:09 PM

## 2019-06-22 NOTE — Progress Notes (Signed)
Physical Therapy Treatment Patient Details Name: Ellen Larson MRN: [id number] DOB: 03/16/1956 Today's Date: 06/22/2019    History of Present Illness 63 y.o. female s/p Exploratory laparotomy, small bowel resection with primary anastomosis, partial colectomy with acsending colostomy    PT Comments    POD # 9 Tolerating reg food (small amount) and eager to get staples removed.  Plans to D/C to daughter's home in [facility] tomorrow.  No equipment needed.  No HH PT needed.  Pt progressing well with her mobility.     Follow Up Recommendations   none      Equipment Recommendations  None recommended by PT    Recommendations for Other Services         Precautions / Restrictions Precautions Precautions: Fall Restrictions Weight Bearing Restrictions: No    Mobility  Bed Mobility Overal bed mobility: Modified Independent       Supine to sit: Modified independent (Device/Increase time)     General bed mobility comments: self able  Transfers Overall transfer level: Needs assistance Equipment used: 1 person hand held assist Transfers: Sit to/from Stand Sit to Stand: Supervision         General transfer comment: good safety cognition and use of hands to steady self  Ambulation/Gait Ambulation/Gait assistance: Supervision Gait Distance (Feet): 500 Feet Assistive device: None Gait Pattern/deviations: Step-through pattern Gait velocity: WFL   General Gait Details: good alternating gait.   Stairs             Wheelchair Mobility    Modified Rankin (Stroke Patients Only)       Balance                                            Cognition   Behavior During Therapy: WFL for tasks assessed/performed Overall Cognitive Status: Within Functional Limits for tasks assessed                                        Exercises      General Comments        Pertinent Vitals/Pain Pain Assessment: No/denies pain    Home  Living                      Prior Function            PT Goals (current goals can now be found in the care plan section) Progress towards PT goals: Progressing toward goals    Frequency    Min 3X/week      PT Plan Current plan remains appropriate    Co-evaluation              AM-PAC PT "6 Clicks" Mobility   Outcome Measure  Help needed turning from your back to your side while in a flat bed without using bedrails?: None Help needed moving from lying on your back to sitting on the side of a flat bed without using bedrails?: None Help needed moving to and from a bed to a chair (including a wheelchair)?: None Help needed standing up from a chair using your arms (e.g., wheelchair or bedside chair)?: None Help needed to walk in hospital room?: None Help needed climbing 3-5 steps with a railing? : A Little 6 Click Score: 23    End of  Session Equipment Utilized During Treatment: Gait belt Activity Tolerance: Patient tolerated treatment well Patient left: in bed;with call bell/phone within reach   PT Visit Diagnosis: Unsteadiness on feet (R26.81);Difficulty in walking, not elsewhere classified (R26.2)     Time: [id number] PT Time Calculation (min) (ACUTE ONLY): 13 min  Charges:  $Therapeutic Exercise: 8-22 mins                     Rica Koyanagi  PTA Acute  Rehabilitation Services Pager      [phone] Office      [phone]

## 2019-06-22 NOTE — Progress Notes (Addendum)
HEMATOLOGY-ONCOLOGY PROGRESS NOTE  DOS 06/22/2019  SUBJECTIVE: The patient reports that she is feeling well today.  Abdominal pain resolved.  Denies nausea and vomiting.  She is eating well.  She has been able to change her colostomy on her own.  She will likely discharge home tomorrow.  She states that she plans to go to her daughter's home in [place], [place].  She anticipates medical oncology follow-up at [facility] and states that her daughter has already arranged this appointment.  REVIEW OF SYSTEMS:   A comprehensive 14 point review of systems was negative except as noted in the HPI.  I have reviewed the past medical history, past surgical history, social history and family history with the patient and they are unchanged from previous note.   PHYSICAL EXAMINATION:  Vitals:   06/22/19 0600 06/22/19 0745  BP: (!) 151/79 (!) 145/67  Pulse: (!) 101 96  Resp: 16 16  Temp: 98.6 F (37 C) 99.6 F (37.6 C)  SpO2: 98% 98%   Filed Weights   06/10/19 1208 06/15/19 1825 06/15/19 2100  Weight: 108 lb (49 kg) 125 lb (56.7 kg) 114 lb 3.2 oz (51.8 kg)    Intake/Output from previous day: 09/22 0701 - 09/23 0700 In: 2142 [P.O.:270; I.V.:1872] Out: 67 [Stool:50]  GENERAL:alert, no distress and comfortable SKIN: skin color, texture, turgor are normal, no rashes or significant lesions EYES: normal, Conjunctiva are pink and non-injected, sclera clear OROPHARYNX:no exudate, no erythema and lips, buccal mucosa, and tongue normal  NECK: supple, thyroid normal size, non-tender, without nodularity LYMPH:  no palpable lymphadenopathy in the cervical, axillary or inguinal LUNGS: clear to auscultation and percussion with normal breathing effort HEART: regular rate & rhythm and no murmurs and no lower extremity edema ABDOMEN: Positive bowel sounds, soft, nontender.  Colostomy in place with brown stool.  Staples clean dry and intact. Musculoskeletal:no cyanosis of digits and no clubbing   NEURO: alert & oriented x 3 with fluent speech, no focal motor/sensory deficits  LABORATORY DATA:  I have reviewed the data as listed CMP Latest Ref Rng & Units 06/22/2019 06/21/2019 06/20/2019  Glucose 70 - 99 mg/dL 103(H) 128(H) 115(H)  BUN 8 - 23 mg/dL 10 14 14   Creatinine 0.44 - 1.00 mg/dL <0.30(L) <0.30(L) <0.30(L)  Sodium 135 - 145 mmol/L 132(L) 133(L) 132(L)  Potassium 3.5 - 5.1 mmol/L 3.7 4.3 4.1  Chloride 98 - 111 mmol/L 101 103 99  CO2 22 - 32 mmol/L 23 23 24   Calcium 8.9 - 10.3 mg/dL 8.3(L) 8.2(L) 8.3(L)  Total Protein 6.5 - 8.1 g/dL 5.7(L) 5.5(L) 5.5(L)  Total Bilirubin 0.3 - 1.2 mg/dL 0.2(L) 0.3 0.4  Alkaline Phos 38 - 126 U/L 259(H) 302(H) 323(H)  AST 15 - 41 U/L 54(H) 135(H) 139(H)  ALT 0 - 44 U/L 142(H) 221(H) 177(H)    Lab Results  Component Value Date   WBC 10.5 06/20/2019   HGB 9.1 (L) 06/20/2019   HCT 28.4 (L) 06/20/2019   MCV 84.0 06/20/2019   PLT 350 06/20/2019   NEUTROABS 7.6 06/20/2019    Dg Abd 1 View  Result Date: 06/12/2019 CLINICAL DATA:  63 year old female with history of abdominal pain. EXAM: ABDOMEN - 1 VIEW COMPARISON:  Abdominal radiograph 06/01/2019. FINDINGS: Some gas and stool are noted in the colon and distal rectum. However, there continue to be multiple dilated loops of small bowel in the central abdomen measuring up to 5.1 cm in diameter, compatible with persistent partial small bowel obstruction. No gross evidence of  pneumoperitoneum on this single supine view. IMPRESSION: 1. Findings are compatible with persistent partial small bowel obstruction. Electronically Signed   By: Vinnie Langton M.D.   On: 06/12/2019 04:52   Ct Chest W Contrast  Result Date: 06/18/2019 CLINICAL DATA:  Stage IV colon cancer EXAM: CT CHEST WITH CONTRAST TECHNIQUE: Multidetector CT imaging of the chest was performed during intravenous contrast administration. CONTRAST:  6m OMNIPAQUE IOHEXOL 300 MG/ML  SOLN COMPARISON:  Correlation with CT abdomen pelvis dated  06/11/2019 FINDINGS: Cardiovascular: The heart is normal in size. No pericardial effusion. No evidence of thoracic aortic aneurysm. Coronary atherosclerosis of the LAD. Right arm PICC terminates at the cavoatrial junction. Mediastinum/Nodes: No suspicious mediastinal, hilar, or axillary lymphadenopathy. 9 mm right thyroid nodule. Lungs/Pleura: Mild biapical pleural-parenchymal scarring. Mild centrilobular emphysematous changes, upper lung predominant. Mild bilateral lower lobe atelectasis with small bilateral pleural effusions. No suspicious pulmonary nodules. No pneumothorax. Upper Abdomen: Visualized upper abdomen is better evaluated on recent CT, noting hepatic metastases and ascites. Known omental caking and the primary colonic lesion are not visualized on the current study. Musculoskeletal: Visualized osseous structures are within normal limits. IMPRESSION: No evidence of metastatic disease in the chest. Small bilateral pleural effusions. Hepatic metastases and upper abdominal ascites, better visualized on recent CT abdomen/pelvis, along with additional pertinent findings. Emphysema (ICD10-J43.9). Electronically Signed   By: SJulian HyM.D.   On: 06/18/2019 12:41   Ct Abdomen Pelvis W Contrast  Result Date: 06/11/2019 CLINICAL DATA:  Abdominal pain. Nausea, vomiting, diarrhea. Bilious vomiting. EXAM: CT ABDOMEN AND PELVIS WITH CONTRAST TECHNIQUE: Multidetector CT imaging of the abdomen and pelvis was performed using the standard protocol following bolus administration of intravenous contrast. CONTRAST:  1057mOMNIPAQUE IOHEXOL 300 MG/ML  SOLN COMPARISON:  None. FINDINGS: Lower chest: Mild hypoventilatory changes at the lung bases. No pleural fluid. Hepatobiliary: Multiple hypodense liver lesions, many of which are ill-defined, suspicious for metastatic disease. Dominant lesion in the subcapsular right lobe measures 4.1 x 2.9 cm, series 2, image 12. Gallbladder is partially distended, no calcified  gallstone. No biliary dilatation. Pancreas: No evidence pancreatic mass. No ductal dilatation or inflammation. Spleen: Normal in size without focal abnormality. Adrenals/Urinary Tract: Mild left adrenal thickening without focal nodule. Normal right adrenal gland. No hydronephrosis or perinephric edema. Homogeneous renal enhancement with symmetric excretion on delayed phase imaging. Small subcentimeter hypodensity in the mid left kidney is too small to characterize but likely cyst. Urinary bladder is physiologically distended without wall thickening. Early excreted contrast in both renal collecting systems in the urinary bladder. Stomach/Bowel: Prominently distended stomach. Dilated fluid-filled small bowel with transition point in the left lower quadrant, series 2, image 53. No obvious small bowel mass or cause of obstruction. More distal small bowel is decompressed. The appendix is normal. Within the proximal transverse colon there is an approximately 4.4 cm segment of irregular low-density colonic wall thickening. Colon distal to this is decompressed and not well evaluated. Mild sigmoid diverticulosis without diverticulitis. Vascular/Lymphatic: Mild aortic atherosclerosis without aneurysm. Portal and mesenteric veins are patent. Suspected prominent pericolonic nodes adjacent irregular transverse wall thickening,, series 2, image 24, however difficult to delineate from adjacent omental stranding. No retroperitoneal adenopathy. No enlarged pelvic lymph nodes. Reproductive: Small enhancing foci anterior to the uterine fundus and posterior to the cervix (series 5, image 59 and series 2, image 72 respectively). Ovaries tentatively visualized and normal. Other: Heterogeneous ill-defined soft tissue density and stranding in the anterior omentum suspicious for omental caking. Small volume abdominopelvic ascites. No  free air. Tiny fat containing umbilical hernia. Musculoskeletal: No blastic or destructive lytic lesions.  Minor degenerative change in the spine. IMPRESSION: 1. Acute finding of small-bowel obstruction with transition point in the left lower quadrant. 2. More ominous finding of irregular 4.4 cm low-density colonic wall thickening in the proximal transverse colon, highly suspicious for primary colonic malignancy. Adjacent omental stranding and small volume abdominopelvic ascites, consistent with omental caking. Small volume abdominopelvic ascites. Small bowel obstruction may be due to mesenteric implant in this setting. Recommend oncologic workup and colonoscopy for further evaluation of potential colonic lesions. 3. Multiple hypodense liver lesions, many of which are ill-defined, suspicious for metastatic disease. 4. Small enhancing foci in the pelvis anterior to the uterine fundus and posterior with cervix may represent pelvic soft tissue deposits or less likely fibroids are also considered. Aortic Atherosclerosis (ICD10-I70.0). Electronically Signed   By: Keith Rake M.D.   On: 06/11/2019 00:53   US Abdomen Limited  Result Date: 06/03/2019 CLINICAL DATA:  Palpable area to the right of the umbilicus EXAM: ULTRASOUND ABDOMEN LIMITED COMPARISON:  None. FINDINGS: There appears to be a small hernia superior to the umbilicus measuring up to 1.4 cm. This appears to contain fat. No visible bowel. No change with Valsalva. No other abnormality noted. IMPRESSION: Small supraumbilical midline ventral hernia containing fat. Electronically Signed   By: Rolm Baptise M.D.   On: 06/03/2019 20:38   [place] Ekg Site Rite  Result Date: 06/11/2019 If Site Rite image not attached, placement could not be confirmed due to current cardiac rhythm.  Pathology:  Diagnosis 1. Small intestine, resection for tumor, proximal ileum - METASTATIC CARCINOMA INVOLVING SMALL INTESTINE. - METASTATIC CARCINOMA IN (2) OF (3) LYMPH NODES. - (4) TUMOR DEPOSITS. 2. Colon, segmental resection for tumor, proximal transverse colon - INVASIVE  COLONIC ADENOCARCINOMA, 5 CM. - TUMOR INVADES THE VISCERAL PERITONEUM. - MARGINS OF RESECTION ARE NOT INVOLVED. - METASTATIC CARCINOMA IN (4) OF (10) LYMPH NODES. - (6) TUMOR DEPOSITS. - SEE ONCOLOGY TABLE.  ASSESSMENT AND PLAN: 63 yo female with   1) Newly diagnosed metastatic colon cancer from transverse colon. Presented with Small bowel obstruction s/p partial colectomy, ascending colostomy and liver biopsy. Baseline CEA level 33.6 CT scan of the chest showed no evidence of metastatic disease in the chest. 2) Liver metastases on CT abd 3) Omental metastases 4) Severe protein calorie malnutrition - weight loss of 20 lbs, prealbumin of <5. Currently on TPN   PLAN -Discussed surgical pathology with the patient which confirms metastatic colon cancer. -Discussed that imaging shows evidence of omental and liver metastases.  No evidence of metastatic disease in the chest. -Oral intake is increasing.  She remains on TPN but this will be stopped tonight. -discussed treatment goal shall be palliative and not curative due to unresectable metastatic disease -will get foundation one testing, MMR testing on tumor sample. -The patient plans to move in with her daughter who lives in [place], [place] and follow-up with [facility] for ongoing medical oncology care.  She currently has a follow-up appointment at the [facility] health cancer center and we will cancel this appointment.  She is aware to call us if she changes her mind and wishes to have follow-up in [place].   LOS: 11 days   Mikey Bussing, DNP, AGPCNP-BC, AOCNP 06/22/19  ADDENDUM  .Patient was Personally and independently interviewed, examined and relevant elements of the history of present illness were reviewed in details and an assessment and plan was created. All elements of  the patient's history of present illness , assessment and plan were discussed in details with Mikey Bussing, DNP. The above documentation reflects our combined  findings assessment and plan.  Patient was seen for medical oncology follow-up in the evening of 06/22/2019.  She notes that she is is just off TPN and her surgical staples came out today.  She is starting to slowly increase her p.o. intake.  Appears to be more awake and active today than during her previous visit. We discussed her surgical pathology results in details.  She has nonresectable metastatic disease to the omentum as well as extensively to the liver that does not appear to be resectable. We discussed that additional foundation 1 testing was requested and is currently pending at this time. We discussed that treatment would be palliative and not curative though it could potentially be quite effective for disease control for significant period of time based on her tolerance of treatment. Typical regimens would be a multidrug chemotherapy like FOLFOX with additional targeted treatment like Avastin or EGFR directed therapy [based on foundation 1 results-RAS mutation status]. We discussed that chemotherapy will typically start anywhere from 2 to 4 weeks depending on her recovery from surgery and nutritional status.  It would be a balancing act to try to start palliative chemotherapy sooner to try to protect her liver function given significant liver metastases but at the same time allow for adequate time for surgical healing given her poor nutritional status.  Patient notes that she has decided to move to her daughter's place in [facility] and she will be continuing her medical oncology cares with Dr. Fanny Skates at [facility].  We discussed that this would likely be in her best interest given she would have the additional social help from her family. We shall cancel her medical oncology outpatient follow-ups with [place]. We will start the best.  She expressed gratitude for all her cares.  Sullivan Lone MD MS

## 2019-06-22 NOTE — Progress Notes (Addendum)
Patient ID: Basma Blaskowski, female   DOB: Feb 25, 1956, 63 y.o.   MRN: [id number]    9 Days Post-Op  Subjective: Patient continues to feel better and hasn't taken anything for her nausea so far today.  Pain seems controlled.  Concerned about getting her medical records  Objective: Vital signs in last 24 hours: Temp:  [98.6 F (37 C)-99 F (37.2 C)] 98.6 F (37 C) (09/23 0600) Pulse Rate:  [98-109] 101 (09/23 0600) Resp:  [15-16] 16 (09/23 0600) BP: (139-151)/(73-79) 151/79 (09/23 0600) SpO2:  [98 %] 98 % (09/23 0600) Last BM Date: 06/21/19  Intake/Output from previous day: 09/22 0701 - 09/23 0700 In: 2142 [P.O.:270; I.V.:1872] Out: 50 [Stool:50] Intake/Output this shift: No intake/output data recorded.  PE: Abd: soft, staples intact and wound is c/d/i.  Ostomy is viable and working well.  +BS, appropriately tender  Lab Results:  Recent Labs    06/20/19 0530  WBC 10.5  HGB 9.1*  HCT 28.4*  PLT 350   BMET Recent Labs    06/21/19 0507 06/22/19 0418  NA 133* 132*  K 4.3 3.7  CL 103 101  CO2 23 23  GLUCOSE 128* 103*  BUN 14 10  CREATININE <0.30* <0.30*  CALCIUM 8.2* 8.3*   PT/INR No results for input(s): LABPROT, INR in the last 72 hours. CMP     Component Value Date/Time   NA 132 (L) 06/22/2019 0418   K 3.7 06/22/2019 0418   CL 101 06/22/2019 0418   CO2 23 06/22/2019 0418   GLUCOSE 103 (H) 06/22/2019 0418   BUN 10 06/22/2019 0418   CREATININE <0.30 (L) 06/22/2019 0418   CALCIUM 8.3 (L) 06/22/2019 0418   PROT 5.7 (L) 06/22/2019 0418   ALBUMIN 2.3 (L) 06/22/2019 0418   AST 54 (H) 06/22/2019 0418   ALT 142 (H) 06/22/2019 0418   ALKPHOS 259 (H) 06/22/2019 0418   BILITOT 0.2 (L) 06/22/2019 0418   GFRNONAA NOT CALCULATED 06/22/2019 0418   GFRAA NOT CALCULATED 06/22/2019 0418   Lipase     Component Value Date/Time   LIPASE 22 06/10/2019 1248       Studies/Results: No results found.  Anti-infectives: Anti-infectives (From admission, onward)   Start     Dose/Rate Route Frequency Ordered Stop   06/13/19 0933  ertapenem (INVANZ) 1,000 mg in sodium chloride 0.9 % 100 mL IVPB     1 g 200 mL/hr over 30 Minutes Intravenous 30 min pre-op 06/13/19 0930 06/13/19 1042       Assessment/Plan Malnutrition - prealbumin10.6, wean TNA today.  Cont ensure  POD 9,S/pExploratory laparotomy, small bowel resection with primary anastomosis, partial colectomy with ascending colostomy9/14 Dr. Carollee Massed colon carcinoma with omental metastasis, peritoneal carcinomatosis, liver metastasis, malignant small bowel obstruction -surgical path confirmed above suspicion with +LNs -will need oncology to see her for further treatment, she is following up with [facility] -ensure -cont WOC teaching for ostomy care -mobilize -pulm toilet, IS -nausea much better, pain controlled with just tylenol -oral agents for pain control -DC TNA today.  If continues to do well, anticipate DC home tomorrow with staples removed prior to discharge.   ID -invanz periop. FEN -soft diet, wean TNA to off today VTE -SCDs, lovenox Foley -d/c 9/16 Follow up -Dr. Harlow Asa   LOS: 11 days    Henreitta Cea , [facility] 06/22/2019, 9:14 AM Pager: [phone]

## 2019-06-23 LAB — GLUCOSE, CAPILLARY: Glucose-Capillary: 84 mg/dL (ref 70–99)

## 2019-06-23 MED ORDER — OXYCODONE HCL 5 MG PO TABS: 5.0000 mg | ORAL_TABLET | Freq: Four times a day (QID) | ORAL | 0 refills | Status: DC | PRN

## 2019-06-23 MED ORDER — ACETAMINOPHEN 500 MG PO TABS: 1000.0000 mg | ORAL_TABLET | Freq: Four times a day (QID) | ORAL | 0 refills | Status: DC

## 2019-06-23 MED ORDER — ONDANSETRON 4 MG PO TBDP: 4.0000 mg | ORAL_TABLET | Freq: Four times a day (QID) | ORAL | 0 refills | Status: DC | PRN

## 2019-06-23 NOTE — Consult Note (Signed)
[facility] Nurse ostomy follow up Patient is discharging to care of her daughter in [place].  She is currently independent with ostomy care but if pouching situation changes, please see attached information provided to patient for outpatient assistance.  A referral from physician is required.  Patient will not have [facility] .  [facility]: The [place] nurse NP who works there is Arvil Persons, she has been an Education officer, museum for 30 years and she sees patients by appointment. She works in the outpatient clinic full time (no wait for appointments, etc.).  She requires a physician referral number is [phone], the referral source should call this number directly to speak to [facility] or to leave a message and she will contact the patient. The address for the [facility] is [place], [place] [address] [place] team will follow through admission.  Domenic Moras MSN, RN, FNP-BC CWON Wound, Ostomy, Continence Nurse Pager [phone]

## 2019-06-23 NOTE — Discharge Summary (Signed)
Physician Discharge Summary  Ellen Larson MLJ:[id number] DOB: 1956-06-01 DOA: 06/10/2019  PCP: Patient, No Pcp Per  Admit date: 06/10/2019 Discharge date: 06/23/2019  Admitted From:home Disposition:home  Recommendations for Outpatient Follow-up:  1. Follow up with PCP in 1-2 weeks 2. Please obtain BMP/CBC in one week  Home Health:she refused Equipment/Devices:none Discharge Condition stable CODE STATUS:full Diet recommendation:cardiac  Brief/Interim Summary:63 y.o.year old femalewith no significant medical history who presented on 9/11/2020with nausea, vomiting, abdominal pain and unexplained significant weight loss over several weeks. She presented to ED after outpatient evaluation for umbilical hernia based on her symptoms was sent to the ED, patient was found to have colonic wall thickening highly suspicious for primary colonic malignancy and concern for mesenteric implant causing small bowel obstruction.  Hospital course:  CT abdomen (06/11/2019)-acute small bowel obstruction, colonic wall thickening in transverse colon suspicious for primary colonic malignancy, abdominal pelvic ascites consistent with omental caking, small obstruction may be due to mesenteric implant, multiple hypodense liver lesions suspicious for metastatic disease  Abdominal x-ray (06/12/2019) multiple dilated loops of small bowel in central abdomen compatible with persistent partial small bowel obstruction  Patient underwent exploratory laparotomy with small bowel resection and primary anastomosis/partial colectomy with colostomy placed on 9/14. Biopsy results pending, operative findings include carcinomatosis, liver metastasis, malignant small bowel obstruction, large primary tumor in proximal transverse colon  Due to poor nutritional status, prealbumin levels less than 5 patient was started on TPN postoperatively. Hospital course currently consist of advancing diet  Dr. 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 Surgical pathology still pending. Patient underwent CT chest with no evidence of metastatic disease on 9/19. 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 Given recent abdominal surgery patient started on TPN on 9/15 for nutritional support until able to tolerate sufficient oral intake. Was able to tolerate soft diet prior to discharge and his TPN was tapered off.  Staples were removed by general surgery prior to discharge.  PT saw the patient recommended home health PT patient refused home health PT.  She will follow-up at the [facility] outpatient ostomy clinic at [address] [place], [place] [address].  She will need to call on call the wound care nurse practitioner Arvil Persons at [phone].  I spoke with the Ellen Larson this morning and she advised me to tell the patient to call her directly to make an appointment.  She does not need a referral.   Transaminitis and elevated alk phos, acute.  Resolving  Transverse colon carcinoma with omental metastasis, peritoneal carcinomatosis, liver metastasis. Operative findings with large primary tumor in proximal transverse colon. Pathology confirms invasive colonic adenocarcinoma, CT chest imaging with no evidence of metastatic disease, oncology plans for close outpatient follow-up. Patient states she would like to follow-up at [facility] as her daughter  lives in [place] for further care  Hypokalemia, resolved.  Hypophosphatemia resolved.   Hyponatremia, resolved.  Unintentional weight loss, likely secondary to malignancy.   Normocytic anemia hemoglobin stable. likely  related to suspected GI malignancy. Nutrition Problem: Inadequate oral intake Etiology: inability to eat    Signs/Symptoms: NPO status     Interventions: TPN  Estimated body mass index is 20.23 kg/m as calculated from the following:   Height as of this encounter: 5' 3"  (1.6 m).   Weight as of this encounter: 51.8 kg.  Discharge Instructions  Discharge Instructions    Call MD for:  difficulty breathing, headache or visual disturbances   Complete by: As directed    Call MD for:  persistant nausea and vomiting   Complete by: As directed    Call MD for:  severe uncontrolled pain   Complete by: As directed    Call MD for:  temperature >100.4   Complete by: As directed    Diet - low sodium heart healthy   Complete by: As directed    Increase activity slowly   Complete by: As directed      Allergies as of 06/23/2019   No Known Allergies     Medication List    TAKE these medications   acetaminophen 500 MG tablet Commonly known as: TYLENOL Take 2 tablets (1,000 mg total) by mouth every 6 (six) hours.   ondansetron 4 MG disintegrating tablet Commonly known as: ZOFRAN-ODT Take 1 tablet (4 mg total) by mouth every 6 (six) hours as needed for nausea.   oxyCODONE 5 MG immediate release tablet Commonly known as: Oxy IR/ROXICODONE Take 1 tablet (5 mg total) by mouth every 6 (six) hours as needed for severe pain or breakthrough pain (pain not relieved by Tylenol/acetaminophen).      Follow-up Information    [name], MD Follow up on 07/06/2019.   Specialty: General Surgery Why: your appointment is at 10:15AM.  Be at the office 30 minutes early for check in.  Bring photo ID and insurance information. Contact information: [address] [place] [address] [phone]          No Known Allergies  Consultations: General surgery oncology  Procedures/Studies: Dg Abd 1 View  Result Date: 06/12/2019 CLINICAL DATA:  63 year old female with history of abdominal pain. EXAM: ABDOMEN - 1 VIEW COMPARISON:  Abdominal radiograph 06/01/2019. FINDINGS: Some gas and stool are noted in the colon and distal rectum. However, there continue to be multiple dilated loops of small bowel in the central abdomen measuring up to 5.1 cm in diameter, compatible with persistent partial small bowel obstruction. No gross evidence of pneumoperitoneum on this single supine view. IMPRESSION: 1. Findings are compatible with persistent partial small bowel obstruction. Electronically Signed   By: Vinnie Langton M.D.   On: 06/12/2019 04:52   Ct Chest W Contrast  Result Date: 06/18/2019 CLINICAL DATA:  Stage IV colon cancer EXAM: CT CHEST WITH CONTRAST TECHNIQUE: Multidetector CT imaging of the chest was performed during intravenous contrast administration. CONTRAST:  57m OMNIPAQUE IOHEXOL 300 MG/ML  SOLN COMPARISON:  Correlation with CT abdomen pelvis dated 06/11/2019 FINDINGS: Cardiovascular: The heart is normal in size. No pericardial effusion. No evidence of thoracic aortic aneurysm. Coronary atherosclerosis of the LAD. Right arm PICC terminates at the cavoatrial junction. Mediastinum/Nodes: No suspicious mediastinal, hilar, or axillary lymphadenopathy. 9 mm right thyroid nodule. Lungs/Pleura: Mild biapical pleural-parenchymal scarring. Mild centrilobular emphysematous changes, upper lung predominant. Mild bilateral lower lobe atelectasis with small bilateral pleural effusions. No suspicious pulmonary nodules. No pneumothorax. Upper Abdomen: Visualized upper abdomen is better evaluated on recent CT, noting hepatic metastases and ascites. Known omental caking and the  primary colonic lesion are not visualized on the current study. Musculoskeletal:  Visualized osseous structures are within normal limits. IMPRESSION: No evidence of metastatic disease in the chest. Small bilateral pleural effusions. Hepatic metastases and upper abdominal ascites, better visualized on recent CT abdomen/pelvis, along with additional pertinent findings. Emphysema (ICD10-J43.9). Electronically Signed   By: Julian Hy M.D.   On: 06/18/2019 12:41   Ct Abdomen Pelvis W Contrast  Result Date: 06/11/2019 CLINICAL DATA:  Abdominal pain. Nausea, vomiting, diarrhea. Bilious vomiting. EXAM: CT ABDOMEN AND PELVIS WITH CONTRAST TECHNIQUE: Multidetector CT imaging of the abdomen and pelvis was performed using the standard protocol following bolus administration of intravenous contrast. CONTRAST:  11m OMNIPAQUE IOHEXOL 300 MG/ML  SOLN COMPARISON:  None. FINDINGS: Lower chest: Mild hypoventilatory changes at the lung bases. No pleural fluid. Hepatobiliary: Multiple hypodense liver lesions, many of which are ill-defined, suspicious for metastatic disease. Dominant lesion in the subcapsular right lobe measures 4.1 x 2.9 cm, series 2, image 12. Gallbladder is partially distended, no calcified gallstone. No biliary dilatation. Pancreas: No evidence pancreatic mass. No ductal dilatation or inflammation. Spleen: Normal in size without focal abnormality. Adrenals/Urinary Tract: Mild left adrenal thickening without focal nodule. Normal right adrenal gland. No hydronephrosis or perinephric edema. Homogeneous renal enhancement with symmetric excretion on delayed phase imaging. Small subcentimeter hypodensity in the mid left kidney is too small to characterize but likely cyst. Urinary bladder is physiologically distended without wall thickening. Early excreted contrast in both renal collecting systems in the urinary bladder. Stomach/Bowel: Prominently distended stomach. Dilated fluid-filled small bowel with transition point in the left lower quadrant, series 2, image 53. No obvious small bowel  mass or cause of obstruction. More distal small bowel is decompressed. The appendix is normal. Within the proximal transverse colon there is an approximately 4.4 cm segment of irregular low-density colonic wall thickening. Colon distal to this is decompressed and not well evaluated. Mild sigmoid diverticulosis without diverticulitis. Vascular/Lymphatic: Mild aortic atherosclerosis without aneurysm. Portal and mesenteric veins are patent. Suspected prominent pericolonic nodes adjacent irregular transverse wall thickening,, series 2, image 24, however difficult to delineate from adjacent omental stranding. No retroperitoneal adenopathy. No enlarged pelvic lymph nodes. Reproductive: Small enhancing foci anterior to the uterine fundus and posterior to the cervix (series 5, image 59 and series 2, image 72 respectively). Ovaries tentatively visualized and normal. Other: Heterogeneous ill-defined soft tissue density and stranding in the anterior omentum suspicious for omental caking. Small volume abdominopelvic ascites. No free air. Tiny fat containing umbilical hernia. Musculoskeletal: No blastic or destructive lytic lesions. Minor degenerative change in the spine. IMPRESSION: 1. Acute finding of small-bowel obstruction with transition point in the left lower quadrant. 2. More ominous finding of irregular 4.4 cm low-density colonic wall thickening in the proximal transverse colon, highly suspicious for primary colonic malignancy. Adjacent omental stranding and small volume abdominopelvic ascites, consistent with omental caking. Small volume abdominopelvic ascites. Small bowel obstruction may be due to mesenteric implant in this setting. Recommend oncologic workup and colonoscopy for further evaluation of potential colonic lesions. 3. Multiple hypodense liver lesions, many of which are ill-defined, suspicious for metastatic disease. 4. Small enhancing foci in the pelvis anterior to the uterine fundus and posterior with  cervix may represent pelvic soft tissue deposits or less likely fibroids are also considered. Aortic Atherosclerosis (ICD10-I70.0). Electronically Signed   By: MKeith RakeM.D.   On: 06/11/2019 00:53   [place]domen Limited  Result Date: 06/03/2019 CLINICAL DATA:  Palpable area to the right  of the umbilicus EXAM: ULTRASOUND ABDOMEN LIMITED COMPARISON:  None. FINDINGS: There appears to be a small hernia superior to the umbilicus measuring up to 1.4 cm. This appears to contain fat. No visible bowel. No change with Valsalva. No other abnormality noted. IMPRESSION: Small supraumbilical midline ventral hernia containing fat. Electronically Signed   By: Rolm Baptise M.D.   On: 06/03/2019 20:38   [place] Ekg Site Rite  Result Date: 06/11/2019 If Site Rite image not attached, placement could not be confirmed due to current cardiac rhythm.   (Echo, Carotid, EGD, Colonoscopy, ERCP)    Subjective: Patient resting in bed anxious to go home  Discharge Exam: Vitals:   06/22/19 2121 06/23/19 0537  BP: 138/71 127/60  Pulse: 97 88  Resp: 16 16  Temp: 98.4 F (36.9 C) 98.3 F (36.8 C)  SpO2: 98% 98%   Vitals:   06/22/19 0745 06/22/19 1329 06/22/19 2121 06/23/19 0537  BP: (!) 145/67 (!) 150/75 138/71 127/60  Pulse: 96 (!) 103 97 88  Resp: 16 15 16 16   Temp: 99.6 F (37.6 C) 98.6 F (37 C) 98.4 F (36.9 C) 98.3 F (36.8 C)  TempSrc: Oral  Oral Oral  SpO2: 98% 97% 98% 98%  Weight:      Height:        General: Pt is alert, awake, not in acute distress Cardiovascular: RRR, S1/S2 +, no rubs, no gallops Respiratory: CTA bilaterally, no wheezing, no rhonchi Abdominal: Soft, NT, ND, bowel sounds + Extremities: no edema, no cyanosis    The results of significant diagnostics from this hospitalization (including imaging, microbiology, ancillary and laboratory) are listed below for reference.     Microbiology: No results found for this or any previous visit (from the past 240 hour(s)).    Labs: BNP (last 3 results) No results for input(s): BNP in the last 8760 hours. Basic Metabolic Panel: Recent Labs  Lab 06/17/19 0356 06/18/19 0411 06/19/19 0611 06/20/19 0530 06/21/19 0507 06/22/19 0418  NA 131* 130* 135 132* 133* 132*  K 4.2 4.0 4.3 4.1 4.3 3.7  CL 100 98 102 99 103 101  CO2 23 24 25 24 23 23   GLUCOSE 134* 139* 109* 115* 128* 103*  BUN 10 12 15 14 14 10   CREATININE <0.30* <0.30* <0.30* <0.30* <0.30* <0.30*  CALCIUM 7.8* 8.0* 8.5* 8.3* 8.2* 8.3*  MG 2.2  --   --  1.9 2.1 2.1  PHOS 2.5  --   --  3.8 3.7 3.5   Liver Function Tests: Recent Labs  Lab 06/20/19 0530 06/21/19 0507 06/22/19 0418  AST 139* 135* 54*  ALT 177* 221* 142*  ALKPHOS 323* 302* 259*  BILITOT 0.4 0.3 0.2*  PROT 5.5* 5.5* 5.7*  ALBUMIN 2.2* 2.2* 2.3*   No results for input(s): LIPASE, AMYLASE in the last 168 hours. No results for input(s): AMMONIA in the last 168 hours. CBC: Recent Labs  Lab 06/20/19 0530  WBC 10.5  NEUTROABS 7.6  HGB 9.1*  HCT 28.4*  MCV 84.0  PLT 350   Cardiac Enzymes: No results for input(s): CKTOTAL, CKMB, CKMBINDEX, TROPONINI in the last 168 hours. BNP: Invalid input(s): POCBNP CBG: Recent Labs  Lab 06/21/19 2214 06/22/19 0602 06/22/19 1451 06/22/19 2122 06/23/19 0541  GLUCAP 109* 110* 147* 99 84   D-Dimer No results for input(s): DDIMER in the last 72 hours. Hgb A1c No results for input(s): HGBA1C in the last 72 hours. Lipid Profile No results for input(s): CHOL, HDL, LDLCALC, TRIG, CHOLHDL, LDLDIRECT  in the last 72 hours. Thyroid function studies No results for input(s): TSH, T4TOTAL, T3FREE, THYROIDAB in the last 72 hours.  Invalid input(s): FREET3 Anemia work up No results for input(s): VITAMINB12, FOLATE, FERRITIN, TIBC, IRON, RETICCTPCT in the last 72 hours. Urinalysis    Component Value Date/Time   COLORURINE YELLOW 06/10/2019 2000   APPEARANCEUR CLOUDY (A) 06/10/2019 2000   LABSPEC 1.024 06/10/2019 2000   PHURINE 5.0  06/10/2019 2000   GLUCOSEU NEGATIVE 06/10/2019 2000   HGBUR NEGATIVE 06/10/2019 2000   BILIRUBINUR NEGATIVE 06/10/2019 2000   KETONESUR 80 (A) 06/10/2019 2000   PROTEINUR 100 (A) 06/10/2019 2000   NITRITE NEGATIVE 06/10/2019 2000   LEUKOCYTESUR TRACE (A) 06/10/2019 2000   Sepsis Labs Invalid input(s): PROCALCITONIN,  WBC,  LACTICIDVEN Microbiology No results found for this or any previous visit (from the past 240 hour(s)).   Time coordinating discharge: 38 minutes  SIGNED:   Georgette Shell, MD  [facility] 06/23/2019, 12:03 PM Pager   If 7PM-7AM, please contact night-coverage [email or web address] Password [email or web address]

## 2019-06-23 NOTE — Discharge Instructions (Addendum)
[facility]      [facility] °[phone] ° °OPEN ABDOMINAL SURGERY: POST OP INSTRUCTIONS ° °Always review your discharge instruction sheet given to you by the facility where your surgery was performed. ° °IF YOU HAVE DISABILITY OR FAMILY LEAVE FORMS, YOU MUST BRING THEM TO THE OFFICE FOR PROCESSING.  PLEASE DO NOT GIVE THEM TO YOUR DOCTOR. ° °1. A prescription for pain medication may be given to you upon discharge.  Take your pain medication as prescribed, if needed.  If narcotic pain medicine is not needed, then you may take acetaminophen (Tylenol) or ibuprofen (Advil) as needed. °2. Take your usually prescribed medications unless otherwise directed. °3. If you need a refill on your pain medication, please contact your pharmacy. They will contact our office to request authorization.  Prescriptions will not be filled after 5pm or on week-ends. °4. You should follow a light diet the first few days after arrival home, such as soup and crackers, pudding, etc.unless your doctor has advised otherwise. A high-fiber, low fat diet can be resumed as tolerated.   Be sure to include lots of fluids daily. Most patients will experience some swelling and bruising on the chest and neck area.  Ice packs will help.  Swelling and bruising can take several days to resolve °5. Most patients will experience some swelling and bruising in the area of the incision. Ice pack will help. Swelling and bruising can take several days to resolve..  °6. It is common to experience some constipation if taking pain medication after surgery.  Increasing fluid intake and taking a stool softener will usually help or prevent this problem from occurring.  A mild laxative (Milk of Magnesia or Miralax) should be taken according to package directions if there are no bowel movements after 48 hours. °7.  You may have steri-strips (small skin tapes) in place directly over the incision.  These strips should be left on the skin for 7-10 days.  If your  surgeon used skin glue on the incision, you may shower in 24 hours.  The glue will flake off over the next 2-3 weeks.  Any sutures or staples will be removed at the office during your follow-up visit. You may find that a light gauze bandage over your incision may keep your staples from being rubbed or pulled. You may shower and replace the bandage daily. °8. ACTIVITIES:  You may resume regular (light) daily activities beginning the next day--such as daily self-care, walking, climbing stairs--gradually increasing activities as tolerated.  You may have sexual intercourse when it is comfortable.  Refrain from any heavy lifting or straining until approved by your doctor. °a. You may drive when you no longer are taking prescription pain medication, you can comfortably wear a seatbelt, and you can safely maneuver your car and apply brakes °b. Return to Work: ___________________________________ °9. You should see your doctor in the office for a follow-up appointment approximately two weeks after your surgery.  Make sure that you call for this appointment within a day or two after you arrive home to insure a convenient appointment time. °OTHER INSTRUCTIONS:  °_____________________________________________________________ °_____________________________________________________________ ° °WHEN TO CALL YOUR DOCTOR: °1. Fever over 101.0 °2. Inability to urinate °3. Nausea and/or vomiting °4. Extreme swelling or bruising °5. Continued bleeding from incision. °6. Increased pain, redness, or drainage from the incision. °7. Difficulty swallowing or breathing °8. Muscle cramping or spasms. °9. Numbness or tingling in hands or feet or around lips. ° °The clinic staff is available to   answer your questions during regular business hours.  Please don’t hesitate to call and ask to speak to one of the nurses if you have concerns. ° °For further questions, please visit [email or web address] ° °••••••••• ° ° °Managing Your Pain After  Surgery Without Opioids ° ° ° °Thank you for participating in our program to help patients manage their pain after surgery without opioids. This is part of our effort to provide you with the best care possible, without exposing you or your family to the risk that opioids pose. ° °What pain can I expect after surgery? °You can expect to have some pain after surgery. This is normal. The pain is typically worse the day after surgery, and quickly begins to get better. °Many studies have found that many patients are able to manage their pain after surgery with Over-the-Counter (OTC) medications such as Tylenol and Motrin. If you have a condition that does not allow you to take Tylenol or Motrin, notify your surgical team. ° °How will I manage my pain? °The best strategy for controlling your pain after surgery is around the clock pain control with Tylenol (acetaminophen) and Motrin (ibuprofen or Advil). Alternating these medications with each other allows you to maximize your pain control. In addition to Tylenol and Motrin, you can use heating pads or ice packs on your incisions to help reduce your pain. ° °How will I alternate your regular strength over-the-counter pain medication? °You will take a dose of pain medication every three hours. °; Start by taking 650 mg of Tylenol (2 pills of 325 mg) °; 3 hours later take 600 mg of Motrin (3 pills of 200 mg) °; 3 hours after taking the Motrin take 650 mg of Tylenol °; 3 hours after that take 600 mg of Motrin. ° ° °- 1 - ° °See example - if your first dose of Tylenol is at 12:00 PM ° ° °12:00 PM Tylenol 650 mg (2 pills of 325 mg)  °3:00 PM Motrin 600 mg (3 pills of 200 mg)  °6:00 PM Tylenol 650 mg (2 pills of 325 mg)  °9:00 PM Motrin 600 mg (3 pills of 200 mg)  °Continue alternating every 3 hours  ° °We recommend that you follow this schedule around-the-clock for at least 3 days after surgery, or until you feel that it is no longer needed. Use the table on the last page of  this handout to keep track of the medications you are taking. °Important: °Do not take more than 3000mg of Tylenol or 3200mg of Motrin in a 24-hour period. °Do not take ibuprofen/Motrin if you have a history of bleeding stomach ulcers, severe kidney disease, &/or actively taking a blood thinner ° °What if I still have pain? °If you have pain that is not controlled with the over-the-counter pain medications (Tylenol and Motrin or Advil) you might have what we call “breakthrough” pain. You will receive a prescription for a small amount of an opioid pain medication such as Oxycodone, Tramadol, or Tylenol with Codeine. Use these opioid pills in the first 24 hours after surgery if you have breakthrough pain. Do not take more than 1 pill every 4-6 hours. ° °If you still have uncontrolled pain after using all opioid pills, don't hesitate to call our staff using the number provided. We will help make sure you are managing your pain in the best way possible, and if necessary, we can provide a prescription for additional pain medication. ° ° °Day 1   ° °Time  °  Name of Medication Number of pills taken  Amount of Acetaminophen  Pain Level   Comments  AM PM       AM PM       AM PM       AM PM       AM PM       AM PM       AM PM       AM PM       Total Daily amount of Acetaminophen Do not take more than  3,000 mg per day      Day 2    Time  Name of Medication Number of pills taken  Amount of Acetaminophen  Pain Level   Comments  AM PM       AM PM       AM PM       AM PM       AM PM       AM PM       AM PM       AM PM       Total Daily amount of Acetaminophen Do not take more than  3,000 mg per day      Day 3    Time  Name of Medication Number of pills taken  Amount of Acetaminophen  Pain Level   Comments  AM PM       AM PM       AM PM       AM PM          AM PM       AM PM       AM PM       AM PM       Total Daily amount of Acetaminophen Do not take more than  3,000 mg per  day      Day 4    Time  Name of Medication Number of pills taken  Amount of Acetaminophen  Pain Level   Comments  AM PM       AM PM       AM PM       AM PM       AM PM       AM PM       AM PM       AM PM       Total Daily amount of Acetaminophen Do not take more than  3,000 mg per day      Day 5    Time  Name of Medication Number of pills taken  Amount of Acetaminophen  Pain Level   Comments  AM PM       AM PM       AM PM       AM PM       AM PM       AM PM       AM PM       AM PM       Total Daily amount of Acetaminophen Do not take more than  3,000 mg per day       Day 6    Time  Name of Medication Number of pills taken  Amount of Acetaminophen  Pain Level  Comments  AM PM       AM PM       AM PM       AM PM       AM   PM       AM PM       AM PM       AM PM       Total Daily amount of Acetaminophen Do not take more than  3,000 mg per day      Day 7    Time  Name of Medication Number of pills taken  Amount of Acetaminophen  Pain Level   Comments  AM PM       AM PM       AM PM       AM PM       AM PM       AM PM       AM PM       AM PM       Total Daily amount of Acetaminophen Do not take more than  3,000 mg per day        For additional information about how and where to safely dispose of unused opioid medications - [employer]  Disclaimer: This document contains information and/or instructional materials adapted from [place] for the typical patient with your condition. It does not replace medical advice from your health care provider because your experience may differ from that of the typical patient. Talk to your health care provider if you have any questions about this document, your condition or your treatment plan. Adapted from [place], Adult  Colostomy surgery is done to create an opening in the front of the abdomen for stool (feces) to leave the body  through an ostomy (stoma). Part of the large intestine is attached to the stoma. A bag, also called a pouch, is fitted over the stoma. Stool and gas will collect in the bag. After surgery, you will need to empty and change your colostomy bag as needed. You will also need to care for your stoma. How to care for the stoma Your stoma should look pink, red, and moist, like the inside of your cheek. Soon after surgery, the stoma may be swollen, but this swelling will go away within 6 weeks. To care for the stoma:  Keep the skin around the stoma clean and dry.  Use a clean, soft washcloth to gently wash the stoma and the skin around it. Clean using a circular motion, and wipe away from the stoma opening, not toward it. ? Use warm water and only use cleansers recommended by your health care provider. ? Rinse the stoma area with plain water. ? Dry the area around the stoma well.  Use stoma powder or ointment on your skin only as told by your health care provider. Do not use any other powders, gels, wipes, or creams on the skin around the stoma.  Check the stoma area every day for signs of infection. Check for: ? New or worsening redness, swelling, or pain. ? New or increased fluid or blood. ? Pus or warmth.  Measure the stoma opening regularly and record the size. Watch for changes. (It is normal for the stoma to get smaller as swelling goes away.) Share this information with your health care provider. How to empty the colostomy bag  Empty your bag at bedtime and whenever it is one-third to one-half full. Do not let the bag get more than half-full with stool or gas. The bag could leak if it gets too full. Some colostomy bags have a built-in gas release valve that releases gas often throughout the day. Follow  these basic steps: 1. Wash your hands with soap and water. 2. Sit far back on the toilet seat. 3. Put several pieces of toilet paper into the toilet water. This will prevent splashing as you  empty stool into the toilet. 4. Remove the clip or the hook-and-loop fastener from the tail end of the bag. 5. Unroll the tail, then empty the stool into the toilet. 6. Clean the tail with toilet paper or a moist towelette. 7. Reroll the tail, and close it with the clip or the hook-and-loop fastener. 8. Wash your hands again. How to change the colostomy bag Change your bag every 3-4 days or as often as told by your health care provider. Also change the bag if it is leaking or separating from the skin, or if your skin around the stoma looks or feels irritated. Irritated skin may be a sign that the bag is leaking. Always have colostomy supplies with you, and follow these basic steps: 1. Wash your hands with soap and water. Have paper towels or tissues nearby to clean any discharge. 2. Remove the old bag and skin barrier. Use your fingers or a warm cloth to gently push the skin away from the barrier. 3. Clean the stoma area with water or with mild soap and water, as directed. Use water to rinse away any soap. 4. Dry the skin. You may use the cool setting on a hair dryer to do this. 5. Use a tracing pattern (template) to cut the skin barrier to the size needed. 6. If you are using a two-piece bag, attach the bag and the skin barrier to each other. Add the barrier ring, if you use one. 7. If directed, apply stoma powder or skin barrier gel to the skin. 8. Warm the skin barrier with your hands, or blow with a hair dryer for 5-10 seconds. 9. Remove the paper from the adhesive strip of the skin barrier. 10. Press the adhesive strip onto the skin around the stoma. 11. Gently rub the skin barrier onto the skin. This creates heat that helps the barrier to stick. 12. Apply stoma tape to the edges of the skin barrier, if desired. 23. Wash your hands again. General recommendations  Avoid wearing tight clothes or having anything press directly on your stoma or bag. Change your clothing whenever it is soiled  or damp.  You may shower or bathe with the bag on or off. Do not use harsh or oily soaps or lotions. Dry the skin and bag after bathing.  Store all supplies in a cool, dry place. Do not leave supplies in extreme heat because some parts can melt or not stick as well.  Whenever you leave home, take extra clothing and an extra skin barrier and bag with you.  If your bag gets wet, you can dry it with a hair dryer on the cool setting.  To prevent odor, you may put drops of ostomy deodorizer in the bag.  If recommended by your health care provider, put ostomy lubricant inside the bag. This helps stool to slide out of the bag more easily and completely. Contact a health care provider if:  You have new or worsening redness, swelling, or pain around your stoma.  You have new or increased fluid or blood coming from your stoma.  Your stoma feels warm to the touch.  You have pus coming from your stoma.  Your stoma extends in or out farther than normal.  You need to change your bag every  day.  You have a fever. Get help right away if:  Your stool is bloody.  You have nausea or you vomit.  You have trouble breathing. Summary  Measure your stoma opening regularly and record the size. Watch for changes.  Empty your bag at bedtime and whenever it is one-third to one-half full. Do not let the bag get more than half-full with stool or gas.  Change your bag every 3-4 days or as often as told by your health care provider.  Whenever you leave home, take extra clothing and an extra skin barrier and bag with you. This information is not intended to replace advice given to you by your health care provider. Make sure you discuss any questions you have with your health care provider. Document Released: 09/18/2003 Document Revised: 01/05/2019 Document Reviewed: 03/11/2017 Elsevier Patient Education  2020 [employer].

## 2019-06-23 NOTE — Progress Notes (Signed)
10 Days Post-Op    CC:N/V/D x 6 weeks  Subjective: She is doing well this AM.  Tolerarting diet and anxious to get out of hospital and go home.  Going to her daughters in [place].  Midline wound healing well, ostomy working well.    Objective: Vital signs in last 24 hours: Temp:  [98.3 F (36.8 C)-98.6 F (37 C)] 98.3 F (36.8 C) (09/24 0537) Pulse Rate:  [88-103] 88 (09/24 0537) Resp:  [15-16] 16 (09/24 0537) BP: (127-150)/(60-75) 127/60 (09/24 0537) SpO2:  [97 %-98 %] 98 % (09/24 0537) Last BM Date: 06/23/19 540 PO 965 IV Afebrile, VSS No labs this AM    Intake/Output from previous day: 09/23 0701 - 09/24 0700 In: 1505.4 [P.O.:540; I.V.:965.4] Out: -  Intake/Output this shift: No intake/output data recorded.  General appearance: alert, cooperative and no distress Resp: clear to auscultation bilaterally GI: soft, tolerating diet, midline wound looks good, Ostomy working well.    Lab Results:  No results for input(s): WBC, HGB, HCT, PLT in the last 72 hours.  BMET Recent Labs    06/21/19 0507 06/22/19 0418  NA 133* 132*  K 4.3 3.7  CL 103 101  CO2 23 23  GLUCOSE 128* 103*  BUN 14 10  CREATININE <0.30* <0.30*  CALCIUM 8.2* 8.3*   PT/INR No results for input(s): LABPROT, INR in the last 72 hours.  Recent Labs  Lab 06/20/19 0530 06/21/19 0507 06/22/19 0418  AST 139* 135* 54*  ALT 177* 221* 142*  ALKPHOS 323* 302* 259*  BILITOT 0.4 0.3 0.2*  PROT 5.5* 5.5* 5.7*  ALBUMIN 2.2* 2.2* 2.3*     Lipase     Component Value Date/Time   LIPASE 22 06/10/2019 1248     Medications: . acetaminophen  1,000 mg Oral Q6H  . Chlorhexidine Gluconate Cloth  6 each Topical Daily  . enoxaparin (LOVENOX) injection  40 mg Subcutaneous Q24H  . feeding supplement (ENSURE ENLIVE)  237 mL Oral BID BM  . methocarbamol  500 mg Oral QID  . metoCLOPramide  10 mg Oral TID AC  . multivitamin with minerals  1 tablet Oral Daily  . sodium chloride flush  10-40 mL  Intracatheter Q12H    Assessment/Plan Malnutrition - prealbumin10.6,wean TNA 06/22/19. Cont ensure  POD9,S/pExploratory laparotomy, small bowel resection with primary anastomosis, partial colectomy with ascending colostomy9/14 Dr. Carollee Massed colon carcinoma with omental metastasis, peritoneal carcinomatosis, liver metastasis, malignant small bowel obstruction -surgical path confirmed above suspicion with +LNs -will need oncology to see her for further treatment, she is following up with [facility] -ensure -cont WOC teaching for ostomy care -mobilize -pulm toilet, IS -nausea much better, pain controlled with just tylenol -oral agents for pain control -DC TNA 06/22/19, she is tolerating her diet and can go home today from our standpoint.     ID -invanz periop. FEN -soft diet,  VTE -SCDs, lovenox Foley -d/c 9/16 Follow up -Dr. Harlow Asa   Plan:  Jodell Cipro removed, she needs copies of her records and a disc with her radiology exams from her to take with her to [facility].  She can go from our standpoint.  I went over discharge instructions with her and follow up is in the AVS.         LOS: 12 days    Haley Roza 06/23/2019 [phone]

## 2019-06-23 NOTE — Plan of Care (Signed)
Discharged home in stable condition.

## 2019-06-23 NOTE — Progress Notes (Signed)
Patient declines Home Health, pt. independent with ambulating and ostomy care at this time.

## 2019-07-06 ENCOUNTER — Encounter (HOSPITAL_COMMUNITY): Payer: Self-pay | Admitting: Hematology

## 2019-07-11 ENCOUNTER — Inpatient Hospital Stay: Payer: Medicare Other | Admitting: Hematology

## 2019-09-03 ENCOUNTER — Emergency Department (HOSPITAL_COMMUNITY)
Admission: EM | Admit: 2019-09-03 | Discharge: 2019-09-04 | Disposition: A | Discharge status: Other Acute Inpatient Hospital | Payer: Medicare Other | Attending: Emergency Medicine | Admitting: Emergency Medicine

## 2019-09-03 ENCOUNTER — Other Ambulatory Visit: Payer: Self-pay

## 2019-09-03 ENCOUNTER — Encounter (HOSPITAL_COMMUNITY): Payer: Self-pay

## 2019-09-03 DIAGNOSIS — Z87891 Personal history of nicotine dependence: Secondary | ICD-10-CM | POA: Insufficient documentation

## 2019-09-03 DIAGNOSIS — Z79899 Other long term (current) drug therapy: Secondary | ICD-10-CM | POA: Diagnosis not present

## 2019-09-03 DIAGNOSIS — K5669 Other partial intestinal obstruction: Secondary | ICD-10-CM | POA: Diagnosis not present

## 2019-09-03 DIAGNOSIS — Z20828 Contact with and (suspected) exposure to other viral communicable diseases: Secondary | ICD-10-CM | POA: Insufficient documentation

## 2019-09-03 DIAGNOSIS — K56600 Partial intestinal obstruction, unspecified as to cause: Secondary | ICD-10-CM

## 2019-09-03 DIAGNOSIS — R109 Unspecified abdominal pain: Secondary | ICD-10-CM | POA: Diagnosis present

## 2019-09-03 HISTORY — DX: Malignant neoplasm of liver, not specified as primary or secondary: C22.9

## 2019-09-03 HISTORY — DX: Malignant (primary) neoplasm, unspecified: C80.1

## 2019-09-03 LAB — CBC WITH DIFFERENTIAL/PLATELET
Abs Immature Granulocytes: 0.02 10*3/uL (ref 0.00–0.07)
Basophils Absolute: 0 10*3/uL (ref 0.0–0.1)
Basophils Relative: 0 %
Eosinophils Absolute: 0.2 10*3/uL (ref 0.0–0.5)
Eosinophils Relative: 2 %
HCT: 38.6 % (ref 36.0–46.0)
Hemoglobin: 11.9 g/dL — ABNORMAL LOW (ref 12.0–15.0)
Immature Granulocytes: 0 %
Lymphocytes Relative: 14 %
Lymphs Abs: 1.1 10*3/uL (ref 0.7–4.0)
MCH: 25 pg — ABNORMAL LOW (ref 26.0–34.0)
MCHC: 30.8 g/dL (ref 30.0–36.0)
MCV: 81.1 fL (ref 80.0–100.0)
Monocytes Absolute: 0.7 10*3/uL (ref 0.1–1.0)
Monocytes Relative: 9 %
Neutro Abs: 5.9 10*3/uL (ref 1.7–7.7)
Neutrophils Relative %: 75 %
Platelets: 370 10*3/uL (ref 150–400)
RBC: 4.76 MIL/uL (ref 3.87–5.11)
RDW: 18.1 % — ABNORMAL HIGH (ref 11.5–15.5)
WBC: 7.9 10*3/uL (ref 4.0–10.5)
nRBC: 0 % (ref 0.0–0.2)

## 2019-09-03 MED ORDER — SODIUM CHLORIDE 0.9 % IV BOLUS
1000.0000 mL | Freq: Once | INTRAVENOUS | Status: AC
  Administered 2019-09-04: 1000 mL via INTRAVENOUS

## 2019-09-03 MED ORDER — ONDANSETRON HCL 4 MG/2ML IJ SOLN
4.0000 mg | Freq: Once | INTRAMUSCULAR | Status: AC
  Administered 2019-09-03: 4 mg via INTRAVENOUS
  Filled 2019-09-03: qty 2

## 2019-09-03 MED ORDER — MORPHINE SULFATE (PF) 4 MG/ML IV SOLN
4.0000 mg | Freq: Once | INTRAVENOUS | Status: AC
  Administered 2019-09-03: 4 mg via INTRAVENOUS
  Filled 2019-09-03: qty 1

## 2019-09-03 NOTE — ED Triage Notes (Signed)
Arrived by [facility] from home with c/o abdominal pain, nausea, vomiting X3 days and no output from colostomy bag today. Family report liver cancer and weight loss of about 40 pounds over last 2 months. Last chemo treatment 2 weeks ago

## 2019-09-04 ENCOUNTER — Encounter (HOSPITAL_COMMUNITY): Payer: Self-pay

## 2019-09-04 ENCOUNTER — Emergency Department (HOSPITAL_COMMUNITY): Payer: Medicare Other

## 2019-09-04 LAB — URINALYSIS, ROUTINE W REFLEX MICROSCOPIC
Bilirubin Urine: NEGATIVE
Glucose, UA: NEGATIVE mg/dL
Hgb urine dipstick: NEGATIVE
Ketones, ur: 80 mg/dL — AB
Leukocytes,Ua: NEGATIVE
Nitrite: NEGATIVE
Protein, ur: NEGATIVE mg/dL
Specific Gravity, Urine: >1.046 — ABNORMAL HIGH (ref 1.005–1.030)
pH: 5 (ref 5.0–8.0)

## 2019-09-04 LAB — COMPREHENSIVE METABOLIC PANEL
ALT: 13 U/L (ref 0–44)
AST: 18 U/L (ref 15–41)
Albumin: 3.2 g/dL — ABNORMAL LOW (ref 3.5–5.0)
Alkaline Phosphatase: 217 U/L — ABNORMAL HIGH (ref 38–126)
Anion gap: 16 — ABNORMAL HIGH (ref 5–15)
BUN: 18 mg/dL (ref 8–23)
CO2: 31 mmol/L (ref 22–32)
Calcium: 9.4 mg/dL (ref 8.9–10.3)
Chloride: 86 mmol/L — ABNORMAL LOW (ref 98–111)
Creatinine, Ser: 0.44 mg/dL (ref 0.44–1.00)
GFR calc Af Amer: >60 mL/min (ref >60)
GFR calc non Af Amer: >60 mL/min (ref >60)
Glucose, Bld: 101 mg/dL — ABNORMAL HIGH (ref 70–99)
Potassium: 3 mmol/L — ABNORMAL LOW (ref 3.5–5.1)
Sodium: 133 mmol/L — ABNORMAL LOW (ref 135–145)
Total Bilirubin: 1.4 mg/dL — ABNORMAL HIGH (ref 0.3–1.2)
Total Protein: 7 g/dL (ref 6.5–8.1)

## 2019-09-04 LAB — LACTIC ACID, PLASMA: Lactic Acid, Venous: 1.4 mmol/L (ref 0.5–1.9)

## 2019-09-04 LAB — POC SARS CORONAVIRUS 2 AG -  ED: SARS Coronavirus 2 Ag: NEGATIVE

## 2019-09-04 LAB — LIPASE, BLOOD: Lipase: 18 U/L (ref 11–51)

## 2019-09-04 MED ORDER — MORPHINE SULFATE (PF) 2 MG/ML IV SOLN
2.0000 mg | INTRAVENOUS | Status: DC | PRN
  Administered 2019-09-04 (×2): 2 mg via INTRAVENOUS
  Filled 2019-09-04 (×2): qty 1

## 2019-09-04 MED ORDER — ONDANSETRON HCL 4 MG/2ML IJ SOLN
4.0000 mg | Freq: Once | INTRAMUSCULAR | Status: AC
  Administered 2019-09-04: 4 mg via INTRAVENOUS
  Filled 2019-09-04: qty 2

## 2019-09-04 MED ORDER — IOHEXOL 300 MG/ML  SOLN
100.0000 mL | Freq: Once | INTRAMUSCULAR | Status: AC | PRN
  Administered 2019-09-04: 100 mL via INTRAVENOUS

## 2019-09-04 MED ORDER — SODIUM CHLORIDE (PF) 0.9 % IJ SOLN
INTRAMUSCULAR | Status: AC
  Administered 2019-09-04: 01:00:00
  Filled 2019-09-04: qty 50

## 2019-09-04 NOTE — ED Provider Notes (Signed)
Patient seen/examined in the Emergency Department in conjunction with Advanced Practice Provider  Patient reports decreased ostomy output.  Pt has extensive abdominal surgical history and previous h/o SBO Exam : awake/alert, ostomy noted, appears uncomfortable Plan: CT imaging planned for further evaluation    Ripley Fraise, MD 09/04/19 [phone]

## 2019-09-04 NOTE — ED Notes (Signed)
Spoke with Vickki Hearing, the crew is on their way from [place], [place] to pick up the patient.

## 2019-09-04 NOTE — ED Notes (Signed)
Patient assisted with ambulation to RR, [facility] at bedside, report given and paperwork given to [place]. EMTALA completed by Melina Copa, MD and signature given by patient, copy given to [facility].

## 2019-09-04 NOTE — ED Provider Notes (Signed)
[facility] DEPT Provider Note   CSN: [id number] Arrival date & time: 09/03/19  2144     History   Chief Complaint Chief Complaint  Patient presents with  . Abdominal Pain    HPI Ellen Larson is a 63 y.o. female with a history of metastatic liver cancer, SBO who presents to the emergency department with a chief complaint of no output from her ostomy bag for the last 2 days.  She is also endorsing diffuse abdominal pain, nausea, and persistent vomiting for 3 days.  She has not been able to keep down any food or fluids despite taking Zofran at home.  She reports that she has had some swelling and distention in her abdomen and last night she and her roommate attempted to press on her abdomen to see if she could make some of the swelling improve and did get a little bit of output in her ostomy bag.  Unfortunately, she went to sleep and woke back up and the distention had returned.  She was started on stool softeners by her oncologist yesterday and reports that a family member gave her a saline enema at home with no improvement in her symptoms.  She denies fever, chills, chest pain, diarrhea, shortness of breath, dysuria, vaginal pain or discharge, rash, melena, hematochezia.     The history is provided by the patient. No language interpreter was used.    Past Medical History:  Diagnosis Date  . Cancer ([place])   . Liver cancer ([place])   . Renal disorder     Patient Active Problem List   Diagnosis Date Noted  . Transaminitis 06/20/2019  . Hyponatremia 06/20/2019  . Protein-calorie malnutrition, severe ([place])   . Hypophosphatemia 06/15/2019  . Normocytic anemia 06/15/2019  . SBO (small bowel obstruction) ([place]) 06/11/2019  . Hypokalemia 06/11/2019  . Weight loss 06/11/2019  . Suspected Colon cancer metastasized to liver [facility]) 06/11/2019    Past Surgical History:  Procedure Laterality Date  . BOWEL RESECTION N/A 06/13/2019   Procedure: SMALL BOWEL  RESECTION;  Surgeon: [name], MD;  Location: [facility] ORS;  Service: General;  Laterality: N/A;  . CERVICAL ABLATION    . kidney stone removal    . PARTIAL COLECTOMY N/A 06/13/2019   Procedure: exploratory laparotomy partial colectomy with ascendingcolostomy;  Surgeon: [name], MD;  Location: [facility] ORS;  Service: General;  Laterality: N/A;     OB History   No obstetric history on file.      Home Medications    Prior to Admission medications   Medication Sig Start Date End Date Taking? Authorizing Provider  dexamethasone (DECADRON) 4 MG tablet Take 4 mg by mouth See admin instructions. 1 tablet for 2 days after chemo 07/09/19  Yes [provider]  lactulose (CHRONULAC) 10 GM/15ML solution Take 10 g by mouth 2 (two) times daily as needed for mild constipation.  09/03/19 09/13/19 Yes [provider]  metoCLOPramide (REGLAN) 5 MG tablet Take 5 mg by mouth every 8 (eight) hours as needed for nausea or vomiting.  07/08/19  Yes [provider]  ondansetron (ZOFRAN) 8 MG tablet Take 8 mg by mouth 2 (two) times daily as needed for nausea.  09/01/19  Yes [provider]  OXYCONTIN 10 MG 12 hr tablet Take 10 mg by mouth every 12 (twelve) hours.  08/15/19  Yes [provider]  senna (SENOKOT) 8.6 MG tablet Take 1 tablet by mouth daily.   Yes [provider]  Family History Family History  Problem Relation Age of Onset  . Heart failure Mother   . Kidney failure Mother   . Cancer Mother     Social History Social History   Tobacco Use  . Smoking status: Former Research scientist (life sciences)  . Smokeless tobacco: Never Used  Substance Use Topics  . Alcohol use: Yes  . Drug use: Not Currently     Allergies   Patient has no known allergies.   Review of Systems Review of Systems  Constitutional: Negative for activity change, chills and fever.  HENT: Negative for congestion and sneezing.   Respiratory: Negative for shortness of breath and wheezing.    Cardiovascular: Negative for chest pain and palpitations.  Gastrointestinal: Positive for abdominal pain, constipation, nausea and vomiting.  Genitourinary: Negative for dysuria, frequency and urgency.  Musculoskeletal: Negative for back pain, myalgias, neck pain and neck stiffness.  Skin: Negative for rash.  Allergic/Immunologic: Negative for immunocompromised state.  Neurological: Negative for dizziness, weakness, numbness and headaches.  Psychiatric/Behavioral: Negative for confusion.     Physical Exam Updated Vital Signs BP 121/70   Pulse 97   Temp 98.6 F (37 C)   Resp 14   Wt 38.6 kg   SpO2 98%   BMI 15.06 kg/m   Physical Exam Vitals signs and nursing note reviewed.  Constitutional:      General: She is not in acute distress.    Appearance: She is not toxic-appearing.     Comments: NAD.   HENT:     Head: Normocephalic.  Eyes:     Conjunctiva/sclera: Conjunctivae normal.  Neck:     Musculoskeletal: Neck supple.  Cardiovascular:     Rate and Rhythm: Normal rate and regular rhythm.     Pulses: Normal pulses.     Heart sounds: Normal heart sounds. No murmur. No friction rub. No gallop.   Pulmonary:     Effort: Pulmonary effort is normal. No respiratory distress.     Breath sounds: No stridor. No wheezing, rhonchi or rales.  Chest:     Chest wall: No tenderness.  Abdominal:     General: There is distension.     Palpations: Abdomen is soft. There is mass.     Tenderness: There is abdominal tenderness. There is no right CVA tenderness or left CVA tenderness.     Comments: Ostomy bag with minimal output.  Abdomen is distended and full in the left lower quadrant.  She is diffusely tender to palpation throughout the abdomen.  No rebound or guarding.  No tenderness to palpation exclusively around the ostomy site.  Skin:    General: Skin is warm.     Findings: No rash.  Neurological:     Mental Status: She is alert.  Psychiatric:        Behavior: Behavior normal.       ED Treatments / Results  Labs (all labs ordered are listed, but only abnormal results are displayed) Labs Reviewed  CBC WITH DIFFERENTIAL/PLATELET - Abnormal; Notable for the following components:      Result Value   Hemoglobin 11.9 (*)    MCH 25.0 (*)    RDW 18.1 (*)    All other components within normal limits  COMPREHENSIVE METABOLIC PANEL - Abnormal; Notable for the following components:   Sodium 133 (*)    Potassium 3.0 (*)    Chloride 86 (*)    Glucose, Bld 101 (*)    Albumin 3.2 (*)    Alkaline Phosphatase 217 (*)  Total Bilirubin 1.4 (*)    Anion gap 16 (*)    All other components within normal limits  URINALYSIS, ROUTINE W REFLEX MICROSCOPIC - Abnormal; Notable for the following components:   Specific Gravity, Urine >1.046 (*)    Ketones, ur 80 (*)    All other components within normal limits  LIPASE, BLOOD  LACTIC ACID, PLASMA  LACTIC ACID, PLASMA  POC SARS CORONAVIRUS 2 AG -  ED    EKG None  Radiology Ct Abdomen Pelvis W Contrast  Result Date: 09/04/2019 CLINICAL DATA:  Bowel obstruction, abdominal pain nausea vomiting, history of metastatic colon cancer EXAM: CT ABDOMEN AND PELVIS WITH CONTRAST TECHNIQUE: Multidetector CT imaging of the abdomen and pelvis was performed using the standard protocol following bolus administration of intravenous contrast. CONTRAST:  161mL OMNIPAQUE IOHEXOL 300 MG/ML  SOLN COMPARISON:  June 17, 2018 FINDINGS: Lower chest: The visualized heart size within normal limits. No pericardial fluid/thickening. No hiatal hernia. There is a moderate left pleural effusion. Adjacent basilar compressive atelectasis is seen. Hepatobiliary: Again noted are multiple hypodense liver masses throughout which have enlarged since the prior exam. The largest in the posterior right liver lobe measures 6.9 x 3.3 cm. The portal vein does appear to be patent. There is a slightly nodular liver contour no evidence of calcified gallstones, gallbladder wall  thickening or biliary dilatation. Pancreas: Unremarkable. No pancreatic ductal dilatation or surrounding inflammatory changes. Spleen: Normal in size without focal abnormality. Adrenals/Urinary Tract: Both adrenal glands appear normal. The kidneys and collecting system appear normal without evidence of urinary tract calculus or hydronephrosis. Bladder is unremarkable. Stomach/Bowel: The stomach is normal and size and appearance. There is mildly dilated fluid-filled loops of small bowel measuring up to 3 cm to the level of mid ileal loops within the mid abdomen series 3, image 51 where there is a focal area of narrowing. The distal ileal loops appear to be decompressed in the anterior abdomen. There is a moderate amount of stool seen within the colostomy site. No surrounding wall thickening or inflammatory changes are seen at the ostomy site. Surgical anastomosis is seen within the lower abdomen. Scattered colonic diverticula are seen within the sigmoid colon. Vascular/Lymphatic: A small amount of abdominopelvic ascites is noted. There is edema within the presacral soft tissues. Scattered aortic atherosclerotic calcifications are seen without aneurysmal dilatation. Reproductive: The uterus and adnexa unremarkable Other: No evidence of hernia. Musculoskeletal: No acute or significant osseous findings. IMPRESSION: 1. Findings of proximal partial small bowel obstruction to the level of distal ileal loops were there is a focal area of narrowing as described above. 2. Right anterior abdominal wall ostomy site which appears to be unremarkable. 3. Small amount of abdominopelvic ascites. 4. Interval progression in extensive hepatic metastatic disease. 5. Small left pleural effusion with adjacent basilar atelectasis. 6.  Aortic Atherosclerosis (ICD10-I70.0). Electronically Signed   By: Prudencio Pair M.D.   On: 09/04/2019 01:23    Procedures Procedures (including critical care time)  Medications Ordered in ED Medications   morphine 2 MG/ML injection 2 mg (2 mg Intravenous Given 09/04/19 0742)  sodium chloride 0.9 % bolus 1,000 mL (0 mLs Intravenous Stopped 09/04/19 0054)  ondansetron (ZOFRAN) injection 4 mg (4 mg Intravenous Given 09/03/19 2345)  morphine 4 MG/ML injection 4 mg (4 mg Intravenous Given 09/03/19 2345)  iohexol (OMNIPAQUE) 300 MG/ML solution 100 mL (100 mLs Intravenous Contrast Given 09/04/19 0050)  sodium chloride (PF) 0.9 % injection (  Given by Other 09/04/19 0047)  Initial Impression / Assessment and Plan / ED Course  I have reviewed the triage vital signs and the nursing notes.  Pertinent labs & imaging results that were available during my care of the patient were reviewed by me and considered in my medical decision making (see chart for details).        63 year old female with a history of metastatic liver cancer, SBO presenting with 2 days of decreased ostomy output, nausea, vomiting, and abdominal pain and distention.  She is hemodynamically stable on arrival.  She appears dehydrated.  Abdomen is distended and tender to palpation.  Minimal output noted in ostomy bag.  The patient was discussed with Dr. Christy Gentles, attending physician.  Labs are notable for mild hypokalemia 3.0.  No leukocytosis.  Lactate is normal.  Urine appears concentrated secondary to dehydration and poor p.o. intake.  Given her history of SBO, CT was ordered to assess for bowel obstruction.  Findings are consistent with a proximal partial small bowel obstruction to the level of the distal ileal loops where there is a focal area of narrowing.   Discussed admission with the patient as she has previously been seen by general surgery at [place], but her oncology team is at [facility].  She would prefer admission to [facility] to be closely followed by oncology.  Consulted the oncology team at [facility] and Dr. Estelle Grumbles has accepted the patient for admission.  He does not recommend an NG tube at this time since the patient symptoms are well  controlled.  She has had no vomiting and pain has been well controlled with morphine and Zofran in the ER.  She received 1 L bolus of IV fluids.  Patient is pending transport to [facility] at this time.   Final Clinical Impressions(s) / ED Diagnoses   Final diagnoses:  Partial small bowel obstruction [facility])    ED Discharge Orders    None       Joanne Gavel, PA-C 09/04/19 0743    Ripley Fraise, MD 09/04/19 2324

## 2019-09-04 NOTE — ED Notes (Signed)
[facility] called for transportation. Paperwork has been faxed and is at nurses station.

## 2019-09-30 DEATH — deceased

## 2020-08-10 IMAGING — CT CT ABD-PELV W/ CM
2 of 5 series · 14 of 46 positions shown, 16 images · IV contrast (omnipaque)
Comparison: June 17, 2018

CLINICAL DATA: Bowel obstruction, abdominal pain nausea vomiting,
history of metastatic colon cancer

EXAM:
CT ABDOMEN AND PELVIS WITH CONTRAST
TECHNIQUE: Multidetector CT imaging of the abdomen and pelvis was performed
using the standard protocol following bolus administration of
intravenous contrast.
CONTRAST:  100mL OMNIPAQUE IOHEXOL 300 MG/ML  SOLN

[Series 3: axial st · axial · 0.59mm/px · z∈[-495,-115]mm · 11 of 88 slices shown, 13 images]
[im 6/88  soft-tissue]
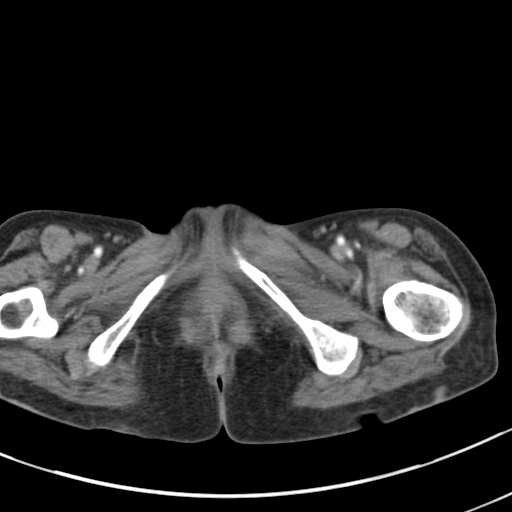
[im 6/88  bone]
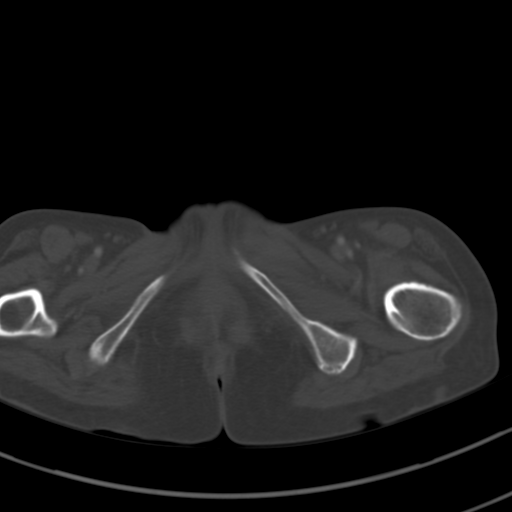
[im 12/88  soft-tissue]
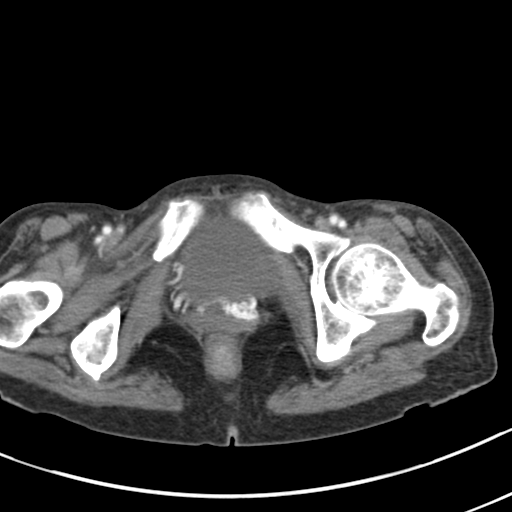
[im 24/88  soft-tissue]
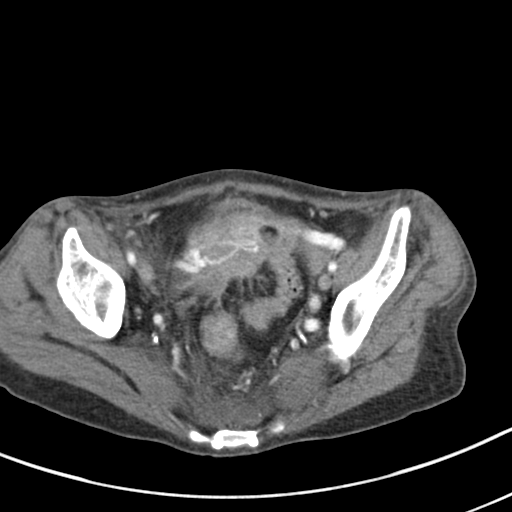
[im 30/88  soft-tissue]
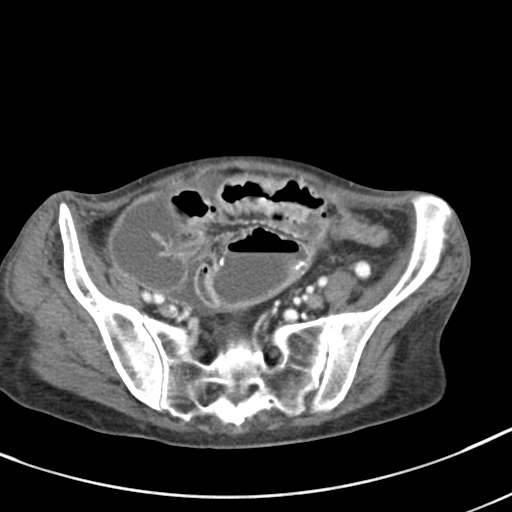
[im 35/88  soft-tissue]
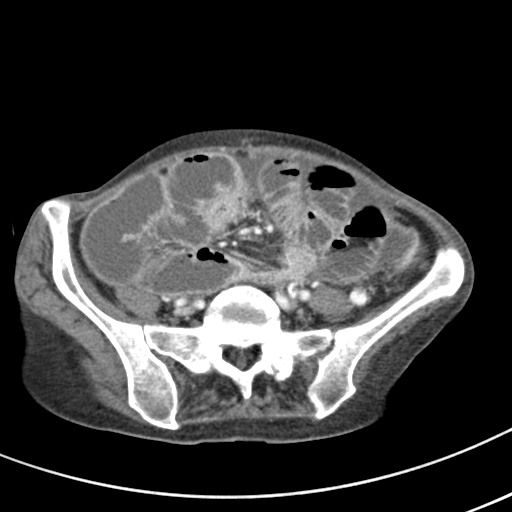
[im 47/88  soft-tissue]
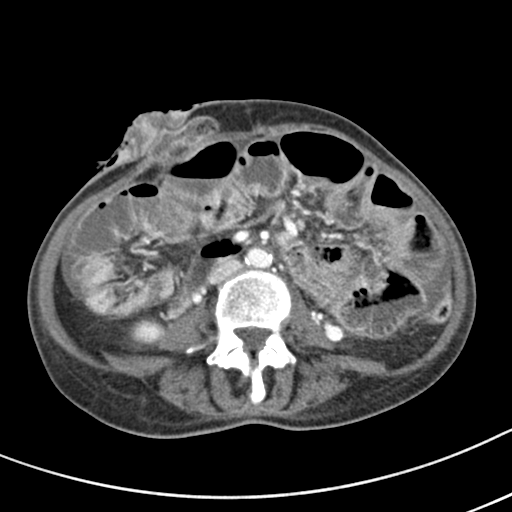
[im 53/88  soft-tissue]
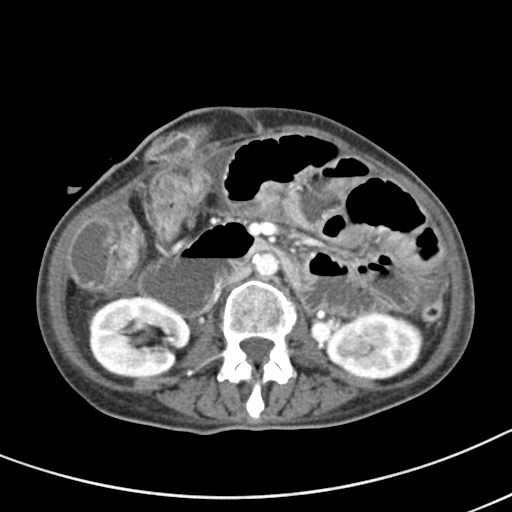
[im 59/88  soft-tissue]
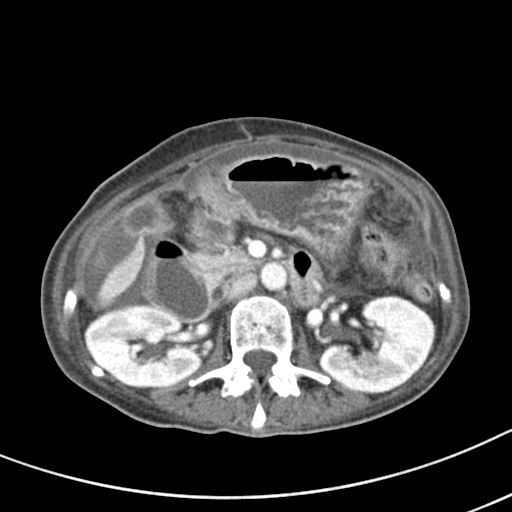
[im 64/88  soft-tissue]
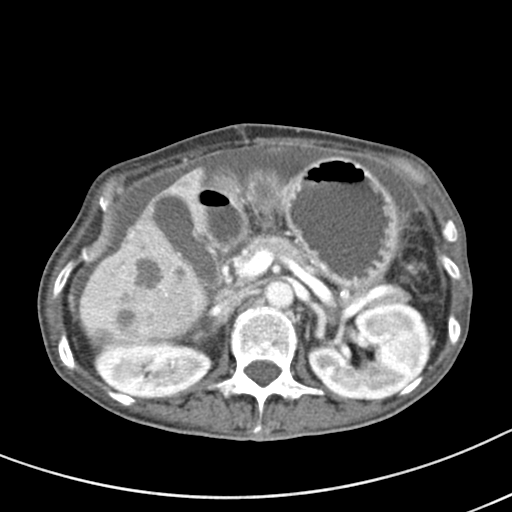
[im 64/88  bone]
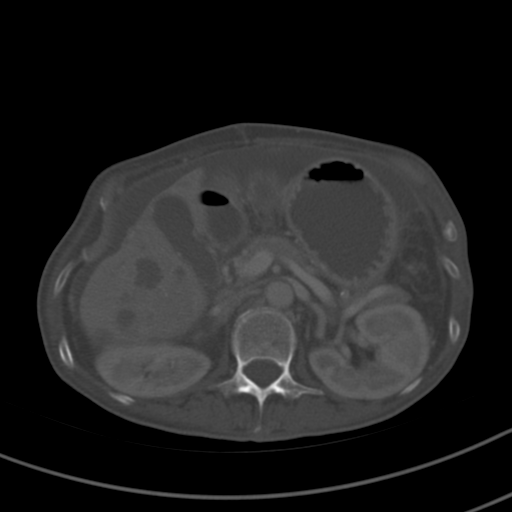
[im 76/88  soft-tissue]
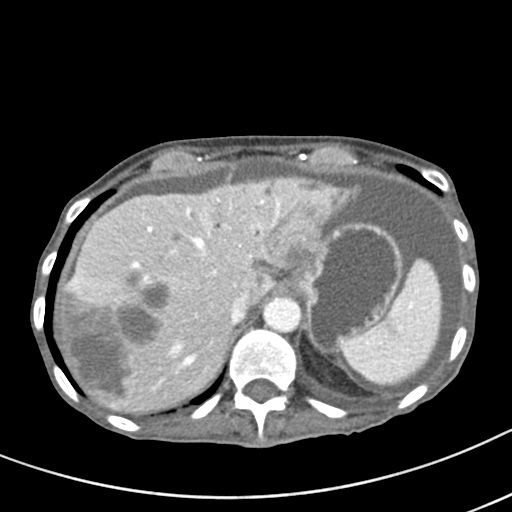
[im 82/88  soft-tissue]
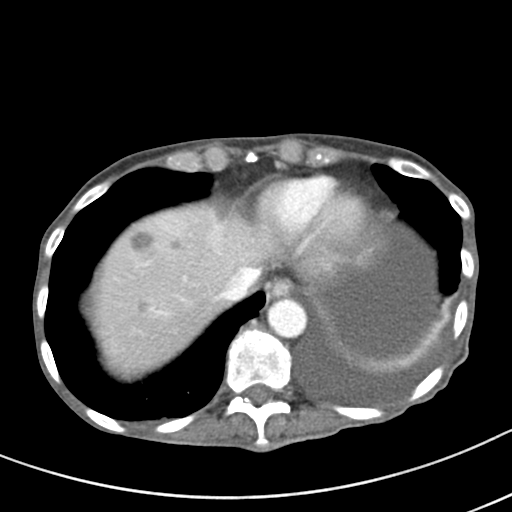

[Series 5: coronal st · coronal · 0.57mm/px · 3 of 108 slices shown]
[im 36/108  soft-tissue]
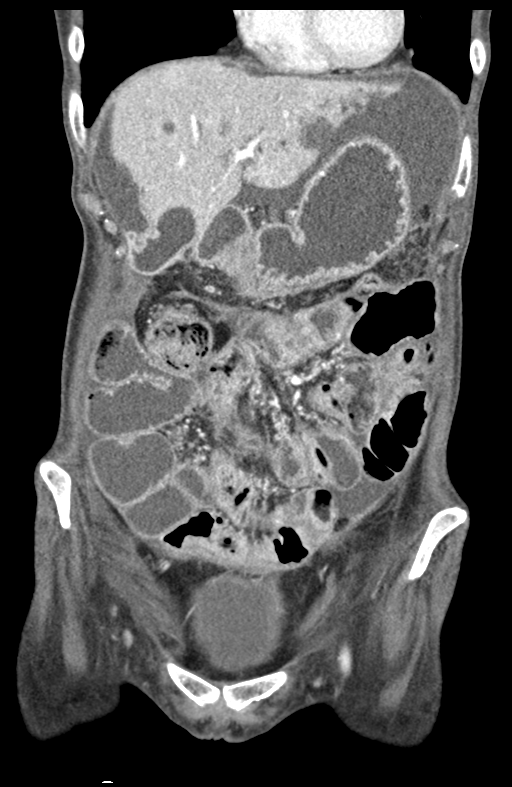
[im 48/108  soft-tissue]
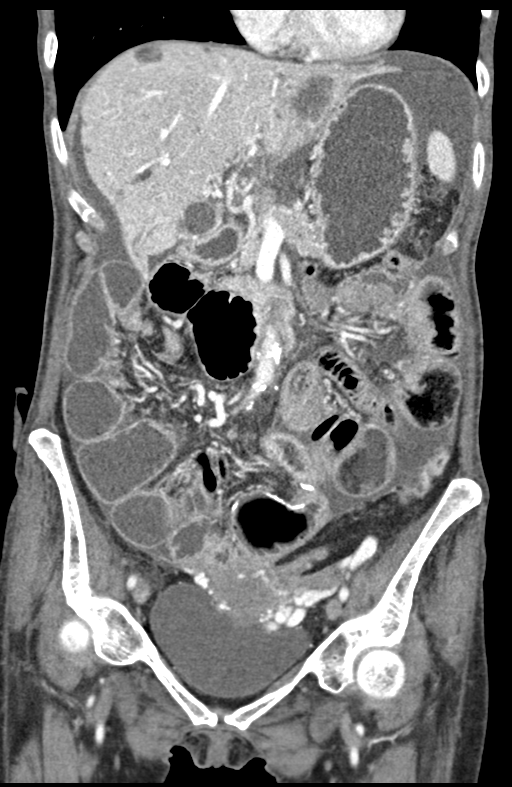
[im 60/108  soft-tissue]
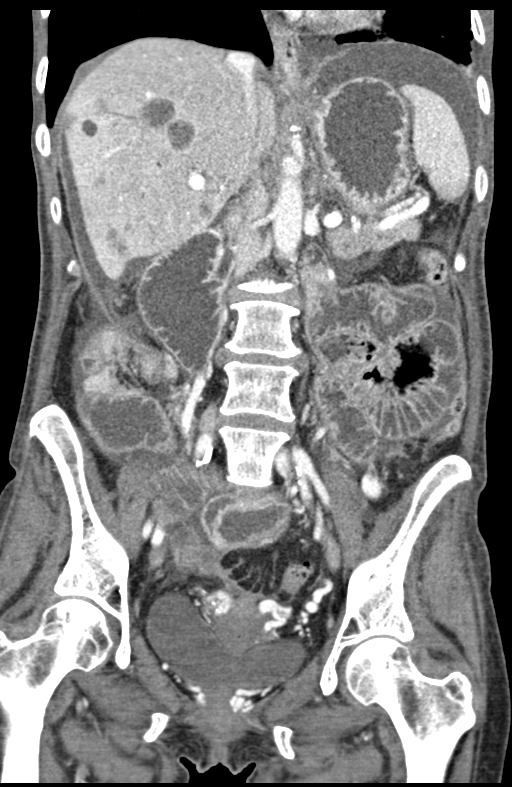

[14 of 46 positions shown; findings below may reference images not displayed]

FINDINGS: Lower chest: The visualized heart size within normal limits. No
pericardial fluid/thickening.

No hiatal hernia.

There is a moderate left pleural effusion. Adjacent basilar
compressive atelectasis is seen.

Hepatobiliary: Again noted are multiple hypodense liver masses
throughout which have enlarged since the prior exam. The largest in
the posterior right liver lobe measures 6.9 x 3.3 cm. The portal
vein does appear to be patent. There is a slightly nodular liver
contour no evidence of calcified gallstones, gallbladder wall
thickening or biliary dilatation.

Pancreas: Unremarkable. No pancreatic ductal dilatation or
surrounding inflammatory changes.

Spleen: Normal in size without focal abnormality.

Adrenals/Urinary Tract: Both adrenal glands appear normal. The
kidneys and collecting system appear normal without evidence of
urinary tract calculus or hydronephrosis. Bladder is unremarkable.

Stomach/Bowel: The stomach is normal and size and appearance. There
is mildly dilated fluid-filled loops of small bowel measuring up to
3 cm to the level of mid ileal loops within the mid abdomen series
3, image 51 where there is a focal area of narrowing. The distal
ileal loops appear to be decompressed in the anterior abdomen. There
is a moderate amount of stool seen within the colostomy site. No
surrounding wall thickening or inflammatory changes are seen at the
ostomy site. Surgical anastomosis is seen within the lower abdomen.
Scattered colonic diverticula are seen within the sigmoid colon.

Vascular/Lymphatic: A small amount of abdominopelvic ascites is
noted. There is edema within the presacral soft tissues. Scattered
aortic atherosclerotic calcifications are seen without aneurysmal
dilatation.

Reproductive: The uterus and adnexa unremarkable

Other: No evidence of hernia.

Musculoskeletal: No acute or significant osseous findings.
IMPRESSION: 1. Findings of proximal partial small bowel obstruction to the level
of distal ileal loops were there is a focal area of narrowing as
described above.
2. Right anterior abdominal wall ostomy site which appears to be
unremarkable.
3. Small amount of abdominopelvic ascites.
4. Interval progression in extensive hepatic metastatic disease.
5. Small left pleural effusion with adjacent basilar atelectasis.
6.  Aortic Atherosclerosis ([id number]).
